# Patient Record
Sex: Female | Born: 1975 | Race: Black or African American | Hispanic: No | Marital: Married | Smoking: Never smoker
Health system: Southern US, Community
[De-identification: ages and names within clinical notes are randomized; demographics above are authoritative.]

## PROBLEM LIST (undated history)

## (undated) DIAGNOSIS — E119 Type 2 diabetes mellitus without complications: Secondary | ICD-10-CM

## (undated) DIAGNOSIS — I10 Essential (primary) hypertension: Secondary | ICD-10-CM

## (undated) HISTORY — PX: OOPHORECTOMY: SHX86

## (undated) HISTORY — PX: CARPAL TUNNEL RELEASE: SHX101

## (undated) HISTORY — DX: Type 2 diabetes mellitus without complications: E11.9

---

## 1998-11-16 ENCOUNTER — Other Ambulatory Visit: Admission: RE | Admit: 1998-11-16 | Discharge: 1998-11-16 | Payer: Self-pay | Admitting: *Deleted

## 1999-06-22 ENCOUNTER — Inpatient Hospital Stay (HOSPITAL_COMMUNITY): Admission: AD | Admit: 1999-06-22 | Discharge: 1999-06-22 | Payer: Self-pay | Admitting: Obstetrics

## 1999-07-02 ENCOUNTER — Inpatient Hospital Stay (HOSPITAL_COMMUNITY): Admission: AD | Admit: 1999-07-02 | Discharge: 1999-07-02 | Payer: Self-pay | Admitting: Obstetrics & Gynecology

## 1999-08-07 ENCOUNTER — Emergency Department (HOSPITAL_COMMUNITY): Admission: EM | Admit: 1999-08-07 | Discharge: 1999-08-07 | Payer: Self-pay | Admitting: Emergency Medicine

## 1999-11-29 ENCOUNTER — Other Ambulatory Visit: Admission: RE | Admit: 1999-11-29 | Discharge: 1999-11-29 | Payer: Self-pay | Admitting: *Deleted

## 2000-05-12 ENCOUNTER — Emergency Department (HOSPITAL_COMMUNITY): Admission: EM | Admit: 2000-05-12 | Discharge: 2000-05-12 | Payer: Self-pay

## 2001-06-19 ENCOUNTER — Inpatient Hospital Stay (HOSPITAL_COMMUNITY): Admission: AD | Admit: 2001-06-19 | Discharge: 2001-06-19 | Payer: Self-pay | Admitting: *Deleted

## 2001-12-23 ENCOUNTER — Emergency Department (HOSPITAL_COMMUNITY): Admission: EM | Admit: 2001-12-23 | Discharge: 2001-12-24 | Payer: Self-pay | Admitting: Emergency Medicine

## 2002-06-22 ENCOUNTER — Inpatient Hospital Stay (HOSPITAL_COMMUNITY): Admission: AD | Admit: 2002-06-22 | Discharge: 2002-06-22 | Payer: Self-pay | Admitting: Obstetrics and Gynecology

## 2004-07-26 ENCOUNTER — Other Ambulatory Visit: Admission: RE | Admit: 2004-07-26 | Discharge: 2004-07-26 | Payer: Self-pay | Admitting: Obstetrics and Gynecology

## 2004-10-14 ENCOUNTER — Ambulatory Visit (HOSPITAL_COMMUNITY): Admission: RE | Admit: 2004-10-14 | Discharge: 2004-10-14 | Payer: Self-pay | Admitting: Obstetrics and Gynecology

## 2005-06-14 ENCOUNTER — Other Ambulatory Visit: Admission: RE | Admit: 2005-06-14 | Discharge: 2005-06-14 | Payer: Self-pay | Admitting: Obstetrics and Gynecology

## 2005-11-18 ENCOUNTER — Inpatient Hospital Stay (HOSPITAL_COMMUNITY): Admission: AD | Admit: 2005-11-18 | Discharge: 2005-11-18 | Payer: Self-pay | Admitting: Family Medicine

## 2014-06-12 ENCOUNTER — Other Ambulatory Visit: Payer: Self-pay | Admitting: Obstetrics and Gynecology

## 2014-06-17 LAB — CYTOLOGY - PAP

## 2014-07-09 ENCOUNTER — Emergency Department (HOSPITAL_COMMUNITY): Payer: BC Managed Care – PPO

## 2014-07-09 ENCOUNTER — Encounter (HOSPITAL_COMMUNITY): Payer: Self-pay | Admitting: Emergency Medicine

## 2014-07-09 ENCOUNTER — Inpatient Hospital Stay (HOSPITAL_COMMUNITY)
Admission: EM | Admit: 2014-07-09 | Discharge: 2014-07-13 | DRG: 390 | Disposition: A | Discharge status: Home / Self Care | Payer: BC Managed Care – PPO | Attending: General Surgery | Admitting: General Surgery

## 2014-07-09 DIAGNOSIS — K5669 Other intestinal obstruction: Secondary | ICD-10-CM | POA: Diagnosis not present

## 2014-07-09 DIAGNOSIS — Z882 Allergy status to sulfonamides status: Secondary | ICD-10-CM

## 2014-07-09 DIAGNOSIS — I10 Essential (primary) hypertension: Secondary | ICD-10-CM | POA: Diagnosis present

## 2014-07-09 DIAGNOSIS — K56609 Unspecified intestinal obstruction, unspecified as to partial versus complete obstruction: Secondary | ICD-10-CM | POA: Diagnosis present

## 2014-07-09 DIAGNOSIS — N809 Endometriosis, unspecified: Secondary | ICD-10-CM | POA: Diagnosis present

## 2014-07-09 DIAGNOSIS — Z90721 Acquired absence of ovaries, unilateral: Secondary | ICD-10-CM | POA: Diagnosis present

## 2014-07-09 HISTORY — DX: Essential (primary) hypertension: I10

## 2014-07-09 LAB — COMPREHENSIVE METABOLIC PANEL
ALBUMIN: 4 g/dL (ref 3.5–5.2)
ALK PHOS: 104 U/L (ref 39–117)
ALT: 61 U/L — ABNORMAL HIGH (ref 0–35)
AST: 29 U/L (ref 0–37)
Anion gap: 17 — ABNORMAL HIGH (ref 5–15)
BUN: 10 mg/dL (ref 6–23)
CO2: 20 mEq/L (ref 19–32)
Calcium: 9.5 mg/dL (ref 8.4–10.5)
Chloride: 100 mEq/L (ref 96–112)
Creatinine, Ser: 0.6 mg/dL (ref 0.50–1.10)
GFR calc Af Amer: >90 mL/min (ref >90)
GFR calc non Af Amer: >90 mL/min (ref >90)
Glucose, Bld: 172 mg/dL — ABNORMAL HIGH (ref 70–99)
POTASSIUM: 3.8 meq/L (ref 3.7–5.3)
Sodium: 137 mEq/L (ref 137–147)
TOTAL PROTEIN: 8.7 g/dL — AB (ref 6.0–8.3)
Total Bilirubin: 0.6 mg/dL (ref 0.3–1.2)

## 2014-07-09 LAB — CBC WITH DIFFERENTIAL/PLATELET
BASOS ABS: 0 10*3/uL (ref 0.0–0.1)
BASOS PCT: 0 % (ref 0–1)
EOS ABS: 0 10*3/uL (ref 0.0–0.7)
Eosinophils Relative: 0 % (ref 0–5)
HCT: 38.6 % (ref 36.0–46.0)
Hemoglobin: 13.5 g/dL (ref 12.0–15.0)
Lymphocytes Relative: 4 % — ABNORMAL LOW (ref 12–46)
Lymphs Abs: 0.6 10*3/uL — ABNORMAL LOW (ref 0.7–4.0)
MCH: 26.4 pg (ref 26.0–34.0)
MCHC: 35 g/dL (ref 30.0–36.0)
MCV: 75.4 fL — ABNORMAL LOW (ref 78.0–100.0)
MONOS PCT: 6 % (ref 3–12)
Monocytes Absolute: 0.8 10*3/uL (ref 0.1–1.0)
NEUTROS PCT: 90 % — AB (ref 43–77)
Neutro Abs: 11.7 10*3/uL — ABNORMAL HIGH (ref 1.7–7.7)
PLATELETS: 407 10*3/uL — AB (ref 150–400)
RBC: 5.12 MIL/uL — ABNORMAL HIGH (ref 3.87–5.11)
RDW: 15.3 % (ref 11.5–15.5)
WBC: 13 10*3/uL — ABNORMAL HIGH (ref 4.0–10.5)

## 2014-07-09 LAB — URINE MICROSCOPIC-ADD ON

## 2014-07-09 LAB — URINALYSIS, ROUTINE W REFLEX MICROSCOPIC
Bilirubin Urine: NEGATIVE
Glucose, UA: NEGATIVE mg/dL
KETONES UR: 40 mg/dL — AB
Leukocytes, UA: NEGATIVE
NITRITE: NEGATIVE
Protein, ur: 100 mg/dL — AB
SPECIFIC GRAVITY, URINE: 1.018 (ref 1.005–1.030)
UROBILINOGEN UA: 1 mg/dL (ref 0.0–1.0)
pH: 5.5 (ref 5.0–8.0)

## 2014-07-09 LAB — I-STAT TROPONIN, ED: Troponin i, poc: 0 ng/mL (ref 0.00–0.08)

## 2014-07-09 LAB — POC URINE PREG, ED: PREG TEST UR: NEGATIVE

## 2014-07-09 LAB — LIPASE, BLOOD: LIPASE: 20 U/L (ref 11–59)

## 2014-07-09 MED ORDER — ONDANSETRON HCL 4 MG/2ML IJ SOLN
4.0000 mg | Freq: Four times a day (QID) | INTRAMUSCULAR | Status: DC | PRN
Start: 2014-07-09 — End: 2014-07-12
  Administered 2014-07-09 – 2014-07-11 (×4): 4 mg via INTRAVENOUS
  Filled 2014-07-09 (×4): qty 2

## 2014-07-09 MED ORDER — MORPHINE SULFATE 2 MG/ML IJ SOLN
2.0000 mg | INTRAMUSCULAR | Status: DC | PRN
  Administered 2014-07-09 – 2014-07-10 (×8): 4 mg via INTRAVENOUS
  Administered 2014-07-11 (×4): 2 mg via INTRAVENOUS
  Administered 2014-07-11: 4 mg via INTRAVENOUS
  Administered 2014-07-11 (×2): 2 mg via INTRAVENOUS
  Filled 2014-07-09 (×2): qty 2
  Filled 2014-07-09 (×4): qty 1
  Filled 2014-07-09 (×4): qty 2
  Filled 2014-07-09: qty 1
  Filled 2014-07-09: qty 2
  Filled 2014-07-09: qty 1
  Filled 2014-07-09 (×2): qty 2

## 2014-07-09 MED ORDER — ONDANSETRON HCL 4 MG/2ML IJ SOLN
4.0000 mg | Freq: Once | INTRAMUSCULAR | Status: AC
  Administered 2014-07-09: 4 mg via INTRAVENOUS
  Filled 2014-07-09: qty 2

## 2014-07-09 MED ORDER — IOHEXOL 300 MG/ML  SOLN
50.0000 mL | Freq: Once | INTRAMUSCULAR | Status: AC | PRN
  Administered 2014-07-09: 50 mL via ORAL

## 2014-07-09 MED ORDER — ENOXAPARIN SODIUM 40 MG/0.4ML ~~LOC~~ SOLN
40.0000 mg | Freq: Every day | SUBCUTANEOUS | Status: DC
  Administered 2014-07-09 – 2014-07-13 (×5): 40 mg via SUBCUTANEOUS
  Filled 2014-07-09 (×5): qty 0.4

## 2014-07-09 MED ORDER — LIDOCAINE HCL 2 % EX GEL
CUTANEOUS | Status: AC
  Administered 2014-07-09: 10
  Filled 2014-07-09: qty 10

## 2014-07-09 MED ORDER — SODIUM CHLORIDE 0.9 % IV BOLUS (SEPSIS)
1000.0000 mL | Freq: Once | INTRAVENOUS | Status: AC
  Administered 2014-07-09: 1000 mL via INTRAVENOUS

## 2014-07-09 MED ORDER — PHENOL 1.4 % MT LIQD
1.0000 | OROMUCOSAL | Status: DC | PRN
  Administered 2014-07-09: 1 via OROMUCOSAL
  Filled 2014-07-09 (×2): qty 177

## 2014-07-09 MED ORDER — MORPHINE SULFATE 4 MG/ML IJ SOLN
4.0000 mg | Freq: Once | INTRAMUSCULAR | Status: AC
  Administered 2014-07-09: 4 mg via INTRAVENOUS
  Filled 2014-07-09: qty 1

## 2014-07-09 MED ORDER — LACTATED RINGERS IV SOLN
INTRAVENOUS | Status: DC
  Administered 2014-07-09: 100 via INTRAVENOUS
  Administered 2014-07-09 – 2014-07-11 (×3): via INTRAVENOUS
  Administered 2014-07-11: 100 mL via INTRAVENOUS

## 2014-07-09 MED ORDER — IOHEXOL 300 MG/ML  SOLN
100.0000 mL | Freq: Once | INTRAMUSCULAR | Status: AC | PRN
  Administered 2014-07-09: 100 mL via INTRAVENOUS

## 2014-07-09 MED ORDER — MORPHINE SULFATE 4 MG/ML IJ SOLN
4.0000 mg | Freq: Once | INTRAMUSCULAR | Status: AC
Start: 2014-07-09 — End: 2014-07-09
  Administered 2014-07-09: 4 mg via INTRAVENOUS
  Filled 2014-07-09: qty 1

## 2014-07-09 NOTE — H&P (Signed)
Amy Klein is an 38 y.o. female.   Chief Complaint: nausea, vomiting HPI: Patient is a 38 year old female with history of hypertension who presents to the emergency department for evaluation of abdominal pain. She reports that the abdominal pain began last night and worsened today. It was associated with vomiting.  She has not had any diarrhea. Last bowel movement was yesterday. She describes her abdominal pain as a bloating feeling. PSH is significant for a right oophorectomy. No fevers, chills.   Past Medical History  Diagnosis Date  . Hypertension     Past Surgical History  Procedure Laterality Date  . Carpal tunnel release    . Oophorectomy Right     History reviewed. No pertinent family history. Social History:  reports that she has never smoked. She has never used smokeless tobacco. She reports that she does not drink alcohol or use illicit drugs.  Allergies:  Allergies  Allergen Reactions  . Sulfa Antibiotics Hives     (Not in a hospital admission)  Results for orders placed during the hospital encounter of 07/09/14 (from the past 48 hour(s))  URINALYSIS, ROUTINE W REFLEX MICROSCOPIC     Status: Abnormal   Collection Time    07/09/14  1:26 AM      Result Value Ref Range   Color, Urine AMBER (*) YELLOW   Comment: BIOCHEMICALS MAY BE AFFECTED BY COLOR   APPearance CLOUDY (*) CLEAR   Specific Gravity, Urine 1.018  1.005 - 1.030   pH 5.5  5.0 - 8.0   Glucose, UA NEGATIVE  NEGATIVE mg/dL   Hgb urine dipstick LARGE (*) NEGATIVE   Bilirubin Urine NEGATIVE  NEGATIVE   Ketones, ur 40 (*) NEGATIVE mg/dL   Protein, ur 100 (*) NEGATIVE mg/dL   Urobilinogen, UA 1.0  0.0 - 1.0 mg/dL   Nitrite NEGATIVE  NEGATIVE   Leukocytes, UA NEGATIVE  NEGATIVE  URINE MICROSCOPIC-ADD ON     Status: Abnormal   Collection Time    07/09/14  1:26 AM      Result Value Ref Range   Squamous Epithelial / LPF RARE  RARE   WBC, UA 0-2  <3 WBC/hpf   RBC / HPF 21-50  <3 RBC/hpf   Bacteria,  UA FEW (*) RARE   Urine-Other MUCOUS PRESENT    CBC WITH DIFFERENTIAL     Status: Abnormal   Collection Time    07/09/14  1:29 AM      Result Value Ref Range   WBC 13.0 (*) 4.0 - 10.5 K/uL   RBC 5.12 (*) 3.87 - 5.11 MIL/uL   Hemoglobin 13.5  12.0 - 15.0 g/dL   HCT 38.6  36.0 - 46.0 %   MCV 75.4 (*) 78.0 - 100.0 fL   MCH 26.4  26.0 - 34.0 pg   MCHC 35.0  30.0 - 36.0 g/dL   RDW 15.3  11.5 - 15.5 %   Platelets 407 (*) 150 - 400 K/uL   Neutrophils Relative % 90 (*) 43 - 77 %   Neutro Abs 11.7 (*) 1.7 - 7.7 K/uL   Lymphocytes Relative 4 (*) 12 - 46 %   Lymphs Abs 0.6 (*) 0.7 - 4.0 K/uL   Monocytes Relative 6  3 - 12 %   Monocytes Absolute 0.8  0.1 - 1.0 K/uL   Eosinophils Relative 0  0 - 5 %   Eosinophils Absolute 0.0  0.0 - 0.7 K/uL   Basophils Relative 0  0 - 1 %   Basophils Absolute  0.0  0.0 - 0.1 K/uL  COMPREHENSIVE METABOLIC PANEL     Status: Abnormal   Collection Time    07/09/14  1:29 AM      Result Value Ref Range   Sodium 137  137 - 147 mEq/L   Potassium 3.8  3.7 - 5.3 mEq/L   Chloride 100  96 - 112 mEq/L   CO2 20  19 - 32 mEq/L   Glucose, Bld 172 (*) 70 - 99 mg/dL   BUN 10  6 - 23 mg/dL   Creatinine, Ser 0.60  0.50 - 1.10 mg/dL   Calcium 9.5  8.4 - 10.5 mg/dL   Total Protein 8.7 (*) 6.0 - 8.3 g/dL   Albumin 4.0  3.5 - 5.2 g/dL   AST 29  0 - 37 U/L   ALT 61 (*) 0 - 35 U/L   Alkaline Phosphatase 104  39 - 117 U/L   Total Bilirubin 0.6  0.3 - 1.2 mg/dL   GFR calc non Af Amer >90  >90 mL/min   GFR calc Af Amer >90  >90 mL/min   Comment: (NOTE)     The eGFR has been calculated using the CKD EPI equation.     This calculation has not been validated in all clinical situations.     eGFR's persistently <90 mL/min signify possible Chronic Kidney     Disease.   Anion gap 17 (*) 5 - 15  LIPASE, BLOOD     Status: None   Collection Time    07/09/14  1:29 AM      Result Value Ref Range   Lipase 20  11 - 59 U/L  I-STAT TROPOININ, ED     Status: None   Collection Time     07/09/14  1:38 AM      Result Value Ref Range   Troponin i, poc 0.00  0.00 - 0.08 ng/mL   Comment 3            Comment: Due to the release kinetics of cTnI,     a negative result within the first hours     of the onset of symptoms does not rule out     myocardial infarction with certainty.     If myocardial infarction is still suspected,     repeat the test at appropriate intervals.  POC URINE PREG, ED     Status: None   Collection Time    07/09/14  1:38 AM      Result Value Ref Range   Preg Test, Ur NEGATIVE  NEGATIVE   Comment:            THE SENSITIVITY OF THIS     METHODOLOGY IS >24 mIU/mL   Ct Abdomen Pelvis W Contrast  07/09/2014   CLINICAL DATA:  Nausea and vomiting for 1 day. Elevated white cell count. Microhematuria.  EXAM: CT ABDOMEN AND PELVIS WITH CONTRAST  TECHNIQUE: Multidetector CT imaging of the abdomen and pelvis was performed using the standard protocol following bolus administration of intravenous contrast.  CONTRAST:  175m OMNIPAQUE IOHEXOL 300 MG/ML  SOLN  COMPARISON:  None.  FINDINGS: Mild atelectasis in the lung bases. Residual contrast material in the lower esophagus may indicate reflux or decreased motility.  The liver, spleen, gallbladder, pancreas, adrenal glands, kidneys, abdominal aorta, inferior vena cava, and retroperitoneal lymph nodes are unremarkable. Stomach is normal. There is proximal small bowel dilatation with distention of fluid filled loops and decompression of the distal small bowel. Transition zone  appears to be in the mid pelvis. Appearance is consistent with small bowel obstruction. There is a vague soft tissue chains demonstrated at the transition zone which may indicate mass or endometriosis. Small amount of free fluid in the mesenteries and pelvic gutters. Colon is stool filled without abnormal distention. No free air in the abdomen.  Pelvis: Appendix is normal. Bladder wall is not thickened. Uterus and ovaries are not enlarged. No destructive bone  lesions. There is mild small bowel wall thickening suggesting inflammation or enteritis.  IMPRESSION: Small bowel obstruction with transition zone in the mid pelvis. Suggestion of enhancing tissue suggesting possible mass or endometriosis at the level of obstruction. Mild small bowel wall thickening and mesenteric fluid are likely reactive or inflammatory.   Electronically Signed   By: Lucienne Capers M.D.   On: 07/09/2014 04:39    Review of Systems  Constitutional: Negative for fever and chills.  Respiratory: Negative for cough and shortness of breath.   Cardiovascular: Negative for chest pain.  Gastrointestinal: Positive for nausea, vomiting and abdominal pain. Negative for diarrhea and constipation.  Genitourinary: Negative for dysuria, urgency and frequency.  Skin: Negative for rash.  Neurological: Negative for headaches.    Blood pressure 122/71, pulse 77, temperature 98.8 F (37.1 C), temperature source Oral, resp. rate 18, last menstrual period 07/07/2014, SpO2 93.00%. Physical Exam  Constitutional: She is oriented to person, place, and time. She appears well-developed and well-nourished.  HENT:  Head: Normocephalic and atraumatic.  Eyes: Conjunctivae are normal. Pupils are equal, round, and reactive to light.  Neck: Normal range of motion.  Cardiovascular: Normal rate and regular rhythm.   Respiratory: Effort normal and breath sounds normal. No respiratory distress.  GI: Soft. She exhibits distension. There is no tenderness. There is no rebound and no guarding.  Musculoskeletal: Normal range of motion.  Neurological: She is alert and oriented to person, place, and time.  Skin: Skin is warm and dry.     Assessment/Plan Amy Klein is a 38 y.o. F with known endometriosis who presents to the ED with a SBO.  On CT there is a transition point associated with a mass near the fundus of her uterus.  We will admit to the floor and place an NG to LWS.  IVF's for hydration and  morphine for pain control.  Seriel abd exams.  Recheck labs in AM.  [place], Lee Kalt C. 36/0/6770, 3:40 AM

## 2014-07-09 NOTE — ED Notes (Signed)
Pt reports that Surgeon has decided to wait and see if obstruction reverses w/ NG tube.

## 2014-07-09 NOTE — Progress Notes (Signed)
[facility] have [facility]ptic [facility], order entered Stanford BreedBracey, Niesha Bame N RN 07-09-2014 10:41am

## 2014-07-09 NOTE — ED Notes (Signed)
Pt complains of vomiting all day

## 2014-07-09 NOTE — ED Provider Notes (Signed)
CSN: [id number]     Arrival date & time 07/09/14  0053 History   First MD Initiated Contact with Patient 07/09/14 0147     Chief Complaint  Patient presents with  . Emesis     (Consider location/radiation/quality/duration/timing/severity/associated sxs/prior Treatment) HPI Comments: Patient is a 38 year old female with history of hypertension who presents to the emergency department for evaluation of abdominal pain. She reports that the abdominal pain began last night and worsened today. It was associated with vomiting. Emesis was nonbilious, nonbloody. She has not had any diarrhea. Last bowel movement was yesterday which was normal for her. She describes her abdominal pain as a bloating feeling. She feels as though her abdomen is distended. She had a right oophorectomy due to endometriosis. Her GYN physician is Dr. Rana Snare at [facility]. She denies any recent travel, suspicious food intake. No fevers, chills.  The history is provided by the patient. No language interpreter was used.    Past Medical History  Diagnosis Date  . Hypertension    Past Surgical History  Procedure Laterality Date  . Carpal tunnel release    . Oophorectomy Right    History reviewed. No pertinent family history. History  Substance Use Topics  . Smoking status: Never Smoker   . Smokeless tobacco: Never Used  . Alcohol Use: No   OB History   Grav Para Term Preterm Abortions TAB SAB Ect Mult Living                 Review of Systems  Constitutional: Negative for fever and chills.  Respiratory: Negative for shortness of breath.   Cardiovascular: Negative for chest pain.  Gastrointestinal: Positive for nausea, vomiting, abdominal pain and abdominal distention. Negative for diarrhea and constipation.  Genitourinary: Negative for dysuria.  All other systems reviewed and are negative.     Allergies  Sulfa antibiotics  Home Medications   Prior to Admission medications   Medication Sig  Start Date End Date Taking? Authorizing Provider  amLODipine (NORVASC) 10 MG tablet Take 10 mg by mouth daily.   Yes Historical Provider, MD  carvedilol (COREG) 12.5 MG tablet Take 12.5 mg by mouth daily.   Yes Historical Provider, MD  ibuprofen (ADVIL,MOTRIN) 200 MG tablet Take 400 mg by mouth every 6 (six) hours as needed for moderate pain.   Yes Historical Provider, MD  Multiple Vitamin (MULTIVITAMIN WITH MINERALS) TABS tablet Take 1 tablet by mouth daily.   Yes Historical Provider, MD   BP 128/77  Pulse 87  Temp(Src) 98.8 F (37.1 C) (Oral)  Resp 18  SpO2 99%  LMP 07/07/2014 Physical Exam  Nursing note and vitals reviewed. Constitutional: She is oriented to person, place, and time. She appears well-developed and well-nourished. No distress.  HENT:  Head: Normocephalic and atraumatic.  Right Ear: External ear normal.  Left Ear: External ear normal.  Nose: Nose normal.  Mouth/Throat: Oropharynx is clear and moist.  Eyes: Conjunctivae are normal.  Neck: Normal range of motion.  Cardiovascular: Normal rate, regular rhythm and normal heart sounds.   Pulmonary/Chest: Effort normal and breath sounds normal. No stridor. No respiratory distress. She has no wheezes. She has no rales.  Abdominal: Soft. She exhibits distension. There is generalized tenderness. There is no rigidity and no guarding.  Abdomen appears distended  Musculoskeletal: Normal range of motion.  Neurological: She is alert and oriented to person, place, and time. She has normal strength.  Skin: Skin is warm and dry. She is not diaphoretic. No  erythema.  Psychiatric: She has a normal mood and affect. Her behavior is normal.    ED Course  Procedures (including critical care time) Labs Review Labs Reviewed  CBC WITH DIFFERENTIAL - Abnormal; Notable for the following:    WBC 13.0 (*)    RBC 5.12 (*)    MCV 75.4 (*)    Platelets 407 (*)    Neutrophils Relative % 90 (*)    Neutro Abs 11.7 (*)    Lymphocytes Relative  4 (*)    Lymphs Abs 0.6 (*)    All other components within normal limits  COMPREHENSIVE METABOLIC PANEL - Abnormal; Notable for the following:    Glucose, Bld 172 (*)    Total Protein 8.7 (*)    ALT 61 (*)    Anion gap 17 (*)    All other components within normal limits  URINALYSIS, ROUTINE W REFLEX MICROSCOPIC - Abnormal; Notable for the following:    Color, Urine AMBER (*)    APPearance CLOUDY (*)    Hgb urine dipstick LARGE (*)    Ketones, ur 40 (*)    Protein, ur 100 (*)    All other components within normal limits  URINE MICROSCOPIC-ADD ON - Abnormal; Notable for the following:    Bacteria, UA FEW (*)    All other components within normal limits  LIPASE, BLOOD  I-STAT TROPOININ, ED  POC URINE PREG, ED    Imaging Review Ct Abdomen Pelvis W Contrast  07/09/2014   CLINICAL DATA:  Nausea and vomiting for 1 day. Elevated white cell count. Microhematuria.  EXAM: CT ABDOMEN AND PELVIS WITH CONTRAST  TECHNIQUE: Multidetector CT imaging of the abdomen and pelvis was performed using the standard protocol following bolus administration of intravenous contrast.  CONTRAST:  100mL OMNIPAQUE IOHEXOL 300 MG/ML  SOLN  COMPARISON:  None.  FINDINGS: Mild atelectasis in the lung bases. Residual contrast material in the lower esophagus may indicate reflux or decreased motility.  The liver, spleen, gallbladder, pancreas, adrenal glands, kidneys, abdominal aorta, inferior vena cava, and retroperitoneal lymph nodes are unremarkable. Stomach is normal. There is proximal small bowel dilatation with distention of fluid filled loops and decompression of the distal small bowel. Transition zone appears to be in the mid pelvis. Appearance is consistent with small bowel obstruction. There is a vague soft tissue chains demonstrated at the transition zone which may indicate mass or endometriosis. Small amount of free fluid in the mesenteries and pelvic gutters. Colon is stool filled without abnormal distention. No free  air in the abdomen.  Pelvis: Appendix is normal. Bladder wall is not thickened. Uterus and ovaries are not enlarged. No destructive bone lesions. There is mild small bowel wall thickening suggesting inflammation or enteritis.  IMPRESSION: Small bowel obstruction with transition zone in the mid pelvis. Suggestion of enhancing tissue suggesting possible mass or endometriosis at the level of obstruction. Mild small bowel wall thickening and mesenteric fluid are likely reactive or inflammatory.   Electronically Signed   By: Burman NievesWilliam  Stevens M.D.   On: 07/09/2014 04:39     EKG Interpretation None      MDM   Final diagnoses:  Small bowel obstruction    Patient presents to ED with SBO. Patient is feeling improved after IV narcotics. NG tube placed. Discussed case with Dr. Maisie Fushomas of [facility] who will admit patient. Admission is appreciated. Vital signs stable at this time. Discussed case with Dr. Rhunette CroftNanavati who agrees with plan. Patient / Family / Caregiver informed of clinical course,  understand medical decision-making process, and agree with plan.     Mora Bellman, PA-C 07/09/14 [phone]

## 2014-07-10 ENCOUNTER — Inpatient Hospital Stay (HOSPITAL_COMMUNITY): Payer: BC Managed Care – PPO

## 2014-07-10 LAB — BASIC METABOLIC PANEL
Anion gap: 12 (ref 5–15)
BUN: 8 mg/dL (ref 6–23)
CO2: 24 mEq/L (ref 19–32)
Calcium: 8.2 mg/dL — ABNORMAL LOW (ref 8.4–10.5)
Chloride: 103 mEq/L (ref 96–112)
Creatinine, Ser: 0.63 mg/dL (ref 0.50–1.10)
Glucose, Bld: 92 mg/dL (ref 70–99)
POTASSIUM: 3.5 meq/L — AB (ref 3.7–5.3)
SODIUM: 139 meq/L (ref 137–147)

## 2014-07-10 LAB — CBC
HCT: 30.7 % — ABNORMAL LOW (ref 36.0–46.0)
Hemoglobin: 10.2 g/dL — ABNORMAL LOW (ref 12.0–15.0)
MCH: 25.5 pg — ABNORMAL LOW (ref 26.0–34.0)
MCHC: 33.2 g/dL (ref 30.0–36.0)
MCV: 76.8 fL — AB (ref 78.0–100.0)
PLATELETS: 300 10*3/uL (ref 150–400)
RBC: 4 MIL/uL (ref 3.87–5.11)
RDW: 15.6 % — ABNORMAL HIGH (ref 11.5–15.5)
WBC: 5.8 10*3/uL (ref 4.0–10.5)

## 2014-07-10 MED ORDER — BISACODYL 10 MG RE SUPP
10.0000 mg | Freq: Once | RECTAL | Status: AC
  Administered 2014-07-10: 10 mg via RECTAL
  Filled 2014-07-10: qty 1

## 2014-07-10 MED ORDER — METOPROLOL TARTRATE 1 MG/ML IV SOLN
5.0000 mg | Freq: Four times a day (QID) | INTRAVENOUS | Status: DC
  Administered 2014-07-10 – 2014-07-12 (×7): 5 mg via INTRAVENOUS
  Filled 2014-07-10 (×11): qty 5

## 2014-07-10 MED ORDER — METOPROLOL TARTRATE 1 MG/ML IV SOLN
2.5000 mg | Freq: Four times a day (QID) | INTRAVENOUS | Status: DC
  Administered 2014-07-10: 2.5 mg via INTRAVENOUS
  Filled 2014-07-10 (×4): qty 5

## 2014-07-10 MED ORDER — POTASSIUM CHLORIDE 10 MEQ/100ML IV SOLN
10.0000 meq | INTRAVENOUS | Status: AC
  Administered 2014-07-10 (×2): 10 meq via INTRAVENOUS
  Filled 2014-07-10 (×2): qty 100

## 2014-07-10 NOTE — ED Provider Notes (Signed)
Medical screening examination/treatment/procedure(s) were performed by non-physician practitioner and as supervising physician I was immediately available for consultation/collaboration.   EKG Interpretation None        Derwood KaplanAnkit Carlita Whitcomb, MD 07/10/14 [id number]

## 2014-07-10 NOTE — Progress Notes (Signed)
HD#2  Pt tells me she had 2 episodes of flatus overnight (differs from what she told NP) Still with some pain NG - mainly with clear secretions/water. No bilious output. Flushed easily  Alert, nontoxic Obese, soft, mild lower abd TTP  No fever, no tachycardia, +flatus, contrast in colon now on xray today Will cont with bowel rest, ng tube decompression Replace potassium Ambulate, OOB  Mary SellaEric M. Andrey CampanileWilson, MD, FACS General, Bariatric, & Minimally Invasive [facility] [place]

## 2014-07-10 NOTE — Progress Notes (Signed)
Patient ID: Amy Klein, female   DOB: 05-26-1976, 38 y.o.   MRN: [id number]     [facility]      [address] [place], [place] [address]    Phone: [phone] FAX: [phone]     Subjective: Small bm yesterday, no flatus.  Still nauseated.  NGT in place.  VSS.  Afebrile.   Objective:  Vital signs:  Filed Vitals:   07/09/14 1810 07/09/14 2208 07/10/14 0144 07/10/14 0542  BP: 140/79 147/80 139/78 142/75  Pulse: 71 70 79 79  Temp: 98.3 F (36.8 C) 98.3 F (36.8 C) 98.4 F (36.9 C) 98.5 F (36.9 C)  TempSrc: Axillary Oral Oral Oral  Resp: '16 16 16 16  ' Height:      Weight:      SpO2: 99% 97% 98% 97%    Last BM Date: 07/07/14  Intake/Output   Yesterday:  10/01 0701 - 10/02 0700 In: 2095 [I.V.:2095] Out: 1750 [Urine:1300; Emesis/NG output:450] This shift:  Total I/O In: -  Out: 500 [Urine:500]  Physical Exam: General: Pt awake/alert/oriented x4 in no acute distress Chest: cta. No chest wall pain w good excursion CV:  Pulses intact.  Regular rhythm Abdomen: Soft.  distended.  TTP pelvic region.  No evidence of peritonitis.  No incarcerated hernias. Ext:  SCDs BLE.  No mjr edema.  No cyanosis Skin: No petechiae / purpura   Problem List:   Active Problems:   SBO (small bowel obstruction)    Results:   Labs: Results for orders placed during the hospital encounter of 07/09/14 (from the past 48 hour(s))  URINALYSIS, ROUTINE W REFLEX MICROSCOPIC     Status: Abnormal   Collection Time    07/09/14  1:26 AM      Result Value Ref Range   Color, Urine AMBER (*) YELLOW   Comment: BIOCHEMICALS MAY BE AFFECTED BY COLOR   APPearance CLOUDY (*) CLEAR   Specific Gravity, Urine 1.018  1.005 - 1.030   pH 5.5  5.0 - 8.0   Glucose, UA NEGATIVE  NEGATIVE mg/dL   Hgb urine dipstick LARGE (*) NEGATIVE   Bilirubin Urine NEGATIVE  NEGATIVE   Ketones, ur 40 (*) NEGATIVE mg/dL   Protein, ur 100 (*) NEGATIVE mg/dL    Urobilinogen, UA 1.0  0.0 - 1.0 mg/dL   Nitrite NEGATIVE  NEGATIVE   Leukocytes, UA NEGATIVE  NEGATIVE  URINE MICROSCOPIC-ADD ON     Status: Abnormal   Collection Time    07/09/14  1:26 AM      Result Value Ref Range   Squamous Epithelial / LPF RARE  RARE   WBC, UA 0-2  <3 WBC/hpf   RBC / HPF 21-50  <3 RBC/hpf   Bacteria, UA FEW (*) RARE   Urine-Other MUCOUS PRESENT    CBC WITH DIFFERENTIAL     Status: Abnormal   Collection Time    07/09/14  1:29 AM      Result Value Ref Range   WBC 13.0 (*) 4.0 - 10.5 K/uL   RBC 5.12 (*) 3.87 - 5.11 MIL/uL   Hemoglobin 13.5  12.0 - 15.0 g/dL   HCT 38.6  36.0 - 46.0 %   MCV 75.4 (*) 78.0 - 100.0 fL   MCH 26.4  26.0 - 34.0 pg   MCHC 35.0  30.0 - 36.0 g/dL   RDW 15.3  11.5 - 15.5 %   Platelets 407 (*) 150 - 400 K/uL   Neutrophils Relative %  90 (*) 43 - 77 %   Neutro Abs 11.7 (*) 1.7 - 7.7 K/uL   Lymphocytes Relative 4 (*) 12 - 46 %   Lymphs Abs 0.6 (*) 0.7 - 4.0 K/uL   Monocytes Relative 6  3 - 12 %   Monocytes Absolute 0.8  0.1 - 1.0 K/uL   Eosinophils Relative 0  0 - 5 %   Eosinophils Absolute 0.0  0.0 - 0.7 K/uL   Basophils Relative 0  0 - 1 %   Basophils Absolute 0.0  0.0 - 0.1 K/uL  COMPREHENSIVE METABOLIC PANEL     Status: Abnormal   Collection Time    07/09/14  1:29 AM      Result Value Ref Range   Sodium 137  137 - 147 mEq/L   Potassium 3.8  3.7 - 5.3 mEq/L   Chloride 100  96 - 112 mEq/L   CO2 20  19 - 32 mEq/L   Glucose, Bld 172 (*) 70 - 99 mg/dL   BUN 10  6 - 23 mg/dL   Creatinine, Ser 0.60  0.50 - 1.10 mg/dL   Calcium 9.5  8.4 - 10.5 mg/dL   Total Protein 8.7 (*) 6.0 - 8.3 g/dL   Albumin 4.0  3.5 - 5.2 g/dL   AST 29  0 - 37 U/L   ALT 61 (*) 0 - 35 U/L   Alkaline Phosphatase 104  39 - 117 U/L   Total Bilirubin 0.6  0.3 - 1.2 mg/dL   GFR calc non Af Amer >90  >90 mL/min   GFR calc Af Amer >90  >90 mL/min   Comment: (NOTE)     The eGFR has been calculated using the CKD EPI equation.     This calculation has not been  validated in all clinical situations.     eGFR's persistently <90 mL/min signify possible Chronic Kidney     Disease.   Anion gap 17 (*) 5 - 15  LIPASE, BLOOD     Status: None   Collection Time    07/09/14  1:29 AM      Result Value Ref Range   Lipase 20  11 - 59 U/L  I-STAT TROPOININ, ED     Status: None   Collection Time    07/09/14  1:38 AM      Result Value Ref Range   Troponin i, poc 0.00  0.00 - 0.08 ng/mL   Comment 3            Comment: Due to the release kinetics of cTnI,     a negative result within the first hours     of the onset of symptoms does not rule out     myocardial infarction with certainty.     If myocardial infarction is still suspected,     repeat the test at appropriate intervals.  POC URINE PREG, ED     Status: None   Collection Time    07/09/14  1:38 AM      Result Value Ref Range   Preg Test, Ur NEGATIVE  NEGATIVE   Comment:            THE SENSITIVITY OF THIS     METHODOLOGY IS >24 mIU/mL  BASIC METABOLIC PANEL     Status: Abnormal   Collection Time    07/10/14  4:26 AM      Result Value Ref Range   Sodium 139  137 - 147 mEq/L   Potassium 3.5 (*)  3.7 - 5.3 mEq/L   Chloride 103  96 - 112 mEq/L   CO2 24  19 - 32 mEq/L   Glucose, Bld 92  70 - 99 mg/dL   BUN 8  6 - 23 mg/dL   Creatinine, Ser 0.63  0.50 - 1.10 mg/dL   Calcium 8.2 (*) 8.4 - 10.5 mg/dL   GFR calc non Af Amer >90  >90 mL/min   GFR calc Af Amer >90  >90 mL/min   Comment: (NOTE)     The eGFR has been calculated using the CKD EPI equation.     This calculation has not been validated in all clinical situations.     eGFR's persistently <90 mL/min signify possible Chronic Kidney     Disease.   Anion gap 12  5 - 15  CBC     Status: Abnormal   Collection Time    07/10/14  4:26 AM      Result Value Ref Range   WBC 5.8  4.0 - 10.5 K/uL   RBC 4.00  3.87 - 5.11 MIL/uL   Hemoglobin 10.2 (*) 12.0 - 15.0 g/dL   Comment: DELTA CHECK NOTED     REPEATED TO VERIFY   HCT 30.7 (*) 36.0 - 46.0 %    MCV 76.8 (*) 78.0 - 100.0 fL   MCH 25.5 (*) 26.0 - 34.0 pg   MCHC 33.2  30.0 - 36.0 g/dL   RDW 15.6 (*) 11.5 - 15.5 %   Platelets 300  150 - 400 K/uL   Comment: DELTA CHECK NOTED     REPEATED TO VERIFY     SPECIMEN CHECKED FOR CLOTS    Imaging / Studies: Ct Abdomen Pelvis W Contrast  07/09/2014   CLINICAL DATA:  Nausea and vomiting for 1 day. Elevated white cell count. Microhematuria.  EXAM: CT ABDOMEN AND PELVIS WITH CONTRAST  TECHNIQUE: Multidetector CT imaging of the abdomen and pelvis was performed using the standard protocol following bolus administration of intravenous contrast.  CONTRAST:  167m OMNIPAQUE IOHEXOL 300 MG/ML  SOLN  COMPARISON:  None.  FINDINGS: Mild atelectasis in the lung bases. Residual contrast material in the lower esophagus may indicate reflux or decreased motility.  The liver, spleen, gallbladder, pancreas, adrenal glands, kidneys, abdominal aorta, inferior vena cava, and retroperitoneal lymph nodes are unremarkable. Stomach is normal. There is proximal small bowel dilatation with distention of fluid filled loops and decompression of the distal small bowel. Transition zone appears to be in the mid pelvis. Appearance is consistent with small bowel obstruction. There is a vague soft tissue chains demonstrated at the transition zone which may indicate mass or endometriosis. Small amount of free fluid in the mesenteries and pelvic gutters. Colon is stool filled without abnormal distention. No free air in the abdomen.  Pelvis: Appendix is normal. Bladder wall is not thickened. Uterus and ovaries are not enlarged. No destructive bone lesions. There is mild small bowel wall thickening suggesting inflammation or enteritis.  IMPRESSION: Small bowel obstruction with transition zone in the mid pelvis. Suggestion of enhancing tissue suggesting possible mass or endometriosis at the level of obstruction. Mild small bowel wall thickening and mesenteric fluid are likely reactive or  inflammatory.   Electronically Signed   By: WLucienne CapersM.D.   On: 07/09/2014 04:39   Dg Abd 2 Views  07/10/2014   CLINICAL DATA:  Small-bowel obstruction with distended abdomen  EXAM: ABDOMEN - 2 VIEW  COMPARISON:  07/09/2014  FINDINGS: A nasogastric catheter is now in  place within the stomach. There remains multiple dilated loops of small bowel with air-fluid levels. Contrast material is now noted within the colon suggesting this is a partial small bowel obstruction. No free air is seen. No abnormal mass or abnormal calcifications are noted.  IMPRESSION: Persistent small bowel obstruction. The overall appearance is stable from the prior exam. Nasogastric catheter in adequate position.   Electronically Signed   By: Inez Catalina M.D.   On: 07/10/2014 08:58    Medications / Allergies:  Scheduled Meds: . enoxaparin (LOVENOX) injection  40 mg Subcutaneous Daily  . metoprolol  2.5 mg Intravenous 4 times per day   Continuous Infusions: . lactated ringers 488 application (89/16/94 5038)   PRN Meds:.morphine injection, ondansetron, phenol  Antibiotics: Anti-infectives   None        Assessment/Plan Hx endometriosis Oophorectomy SBO No improvement on AXR, 445m out of NGT, more water, little bile and gastric content, no stool.  NGT flushed to ensure its functional.  She has not passed flatus, had a small BM yesterday.   We will continue with conservative management for another day, repeat films tomorrow IV hydration Pain control Dulcolax x1 SCD/lovenox mobilize  HTN Hold amlodipine, metoprolol for coreg substitution   EErby Pian ANP-BC [facility] Pager [phone]([id number])   07/10/2014 10:21 AM

## 2014-07-11 ENCOUNTER — Inpatient Hospital Stay (HOSPITAL_COMMUNITY): Payer: BC Managed Care – PPO

## 2014-07-11 LAB — BASIC METABOLIC PANEL
Anion gap: 13 (ref 5–15)
BUN: 5 mg/dL — ABNORMAL LOW (ref 6–23)
CALCIUM: 8.6 mg/dL (ref 8.4–10.5)
CHLORIDE: 101 meq/L (ref 96–112)
CO2: 26 mEq/L (ref 19–32)
Creatinine, Ser: 0.55 mg/dL (ref 0.50–1.10)
GFR calc non Af Amer: >90 mL/min (ref >90)
Glucose, Bld: 88 mg/dL (ref 70–99)
Potassium: 3.3 mEq/L — ABNORMAL LOW (ref 3.7–5.3)
Sodium: 140 mEq/L (ref 137–147)

## 2014-07-11 NOTE — Progress Notes (Signed)
Patient ID: Amy Klein, female   DOB: 06/21/1976, 38 y.o.   MRN: [id number]  General Surgery - [facility] - Progress Note  Subjective: Patient up in room, family at bedside.  Denies nausea or emesis.  Mild pain.  Going for AXR this AM.  Objective: Vital signs in last 24 hours: Temp:  [98.5 F (36.9 C)-99.3 F (37.4 C)] 98.8 F (37.1 C) (10/03 0535) Pulse Rate:  [81-86] 85 (10/03 0535) Resp:  [18] 18 (10/03 0535) BP: (124-135)/(72-89) 135/89 mmHg (10/03 0535) SpO2:  [96 %-98 %] 98 % (10/03 0535) Last BM Date: 08/06/14  Intake/Output from previous day: 10/02 0701 - 10/03 0700 In: 2600 [I.V.:2400; IV Piggyback:200] Out: 3600 [Urine:1800; Emesis/NG output:1800]  Exam: HEENT - clear, not icteric Neck - soft Chest - clear bilaterally Cor - RRR, no murmur Abd - soft, mild distension; BS present; mild diffuse tenderness to palpation Ext - no significant edema Neuro - grossly intact, no focal deficits  Lab Results:   Recent Labs  07/09/14 0129 07/10/14 0426  WBC 13.0* 5.8  HGB 13.5 10.2*  HCT 38.6 30.7*  PLT 407* 300     Recent Labs  07/10/14 0426 07/11/14 0540  NA 139 140  K 3.5* 3.3*  CL 103 101  CO2 24 26  GLUCOSE 92 88  BUN 8 5*  CREATININE 0.63 0.55  CALCIUM 8.2* 8.6    Studies/Results: Dg Abd 2 Views  07/10/2014   CLINICAL DATA:  Small-bowel obstruction with distended abdomen  EXAM: ABDOMEN - 2 VIEW  COMPARISON:  07/09/2014  FINDINGS: A nasogastric catheter is now in place within the stomach. There remains multiple dilated loops of small bowel with air-fluid levels. Contrast material is now noted within the colon suggesting this is a partial small bowel obstruction. No free air is seen. No abnormal mass or abnormal calcifications are noted.  IMPRESSION: Persistent small bowel obstruction. The overall appearance is stable from the prior exam. Nasogastric catheter in adequate position.   Electronically Signed   By: Alcide CleverMark  Lukens M.D.    On: 07/10/2014 08:58    Assessment / Plan: 1.  SBO  Check AXR this AM  If improving, start clear liquid diet and clamp NG tube  IV hydration  Ambulate  Velora Hecklerodd M. Jaana Brodt, MD, [facility] Office: [phone]  07/11/2014

## 2014-07-12 MED ORDER — CARVEDILOL 12.5 MG PO TABS
12.5000 mg | ORAL_TABLET | Freq: Every day | ORAL | Status: DC
  Administered 2014-07-12 – 2014-07-13 (×2): 12.5 mg via ORAL
  Filled 2014-07-12 (×3): qty 1

## 2014-07-12 MED ORDER — AMLODIPINE BESYLATE 10 MG PO TABS
10.0000 mg | ORAL_TABLET | Freq: Every day | ORAL | Status: DC
  Administered 2014-07-12 – 2014-07-13 (×2): 10 mg via ORAL
  Filled 2014-07-12 (×2): qty 1

## 2014-07-12 NOTE — Progress Notes (Signed)
Patient ID: Amy HarderChristina Klein, female   DOB: [birth year], 38 y.o.   MRN: [id number]  General Surgery - [facility] - Progress Note  Subjective: Patient wants NG out - no nausea or emesis with NG clamped for 24 hrs.  No abd pain.  Passing flatus, no BM per pt.  Objective: Vital signs in last 24 hours: Temp:  [98.8 F (37.1 C)-99.6 F (37.6 C)] 98.8 F (37.1 C) (10/04 0602) Pulse Rate:  [67-92] 78 (10/04 0602) Resp:  [17-18] 18 (10/04 0602) BP: (118-137)/(66-84) 134/82 mmHg (10/04 0602) SpO2:  [96 %-100 %] 100 % (10/04 0602) Last BM Date: 07/10/14  Intake/Output from previous day: 10/03 0701 - 10/04 0700 In: 2577.5 [P.O.:1200; I.V.:1337.5; NG/GT:40] Out: 1350 [Urine:1350]  Exam: HEENT - clear, not icteric Neck - soft Chest - clear bilaterally Cor - RRR, no murmur Abd - soft, mild distension; no tenderness; BS present Ext - no significant edema Neuro - grossly intact, no focal deficits  Lab Results:   Recent Labs  07/10/14 0426  WBC 5.8  HGB 10.2*  HCT 30.7*  PLT 300     Recent Labs  07/10/14 0426 07/11/14 0540  NA 139 140  K 3.5* 3.3*  CL 103 101  CO2 24 26  GLUCOSE 92 88  BUN 8 5*  CREATININE 0.63 0.55  CALCIUM 8.2* 8.6    Studies/Results: Dg Abd 2 Views  07/11/2014   CLINICAL DATA:  Evaluate for small bowel obstruction. Abdominal soreness.  EXAM: ABDOMEN - 2 VIEW  COMPARISON:  One day prior and CT of 2 days prior  FINDINGS: Nasogastric tube terminates at distal stomach. The upright film demonstrates no free intraperitoneal air. Air-fluid levels within mid small bowel loops. Slightly less small bowel distention, 4.5 cm today versus 5.2 cm on the prior. Contrast within the colon.  IMPRESSION: Minimal improvement in partial small bowel obstruction. No free intraperitoneal air or other acute complication.   Electronically Signed   By: Jeronimo GreavesKyle  Talbot M.D.   On: 07/11/2014 10:37   Dg Abd 2 Views  07/10/2014   CLINICAL DATA:  Small-bowel  obstruction with distended abdomen  EXAM: ABDOMEN - 2 VIEW  COMPARISON:  07/09/2014  FINDINGS: A nasogastric catheter is now in place within the stomach. There remains multiple dilated loops of small bowel with air-fluid levels. Contrast material is now noted within the colon suggesting this is a partial small bowel obstruction. No free air is seen. No abnormal mass or abnormal calcifications are noted.  IMPRESSION: Persistent small bowel obstruction. The overall appearance is stable from the prior exam. Nasogastric catheter in adequate position.   Electronically Signed   By: Alcide CleverMark  Lukens M.D.   On: 07/10/2014 08:58    Assessment / Plan: 1.  Small bowel obstruction  Clinically resolving  Discontinue NG tube  Full liquid diet  Ambulate  Velora Hecklerodd M. Uri Covey, MD, [facility] Office: [phone]  07/12/2014

## 2014-07-13 ENCOUNTER — Encounter (HOSPITAL_COMMUNITY): Payer: Self-pay | Admitting: *Deleted

## 2014-07-13 NOTE — Discharge Instructions (Signed)
Small Bowel Obstruction °A small bowel obstruction is a blockage (obstruction) of the small intestine (small bowel). The small bowel is a long, slender tube that connects the stomach to the colon. Its job is to absorb nutrients from the fluids and foods you consume into the bloodstream.  °CAUSES  °There are many causes of intestinal blockage. The most common ones include: °· Hernias. This is a more common cause in children than adults. °· Inflammatory bowel disease (enteritis and colitis). °· Twisting of the bowel (volvulus). °· Tumors. °· Scar tissue (adhesions) from previous surgery or radiation treatment. °· Recent surgery. This may cause an acute small bowel obstruction called an ileus. °SYMPTOMS  °· Abdominal pain. This may be dull cramps or sharp pain. It may occur in one area or may be present in the entire abdomen. Pain can range from mild to severe, depending on the degree of obstruction. °· Nausea and vomiting. Vomit may be greenish or yellow bile color. °· Distended or swollen stomach. Abdominal bloating is a common symptom. °· Constipation. °· Lack of passing gas. °· Frequent belching. °· Diarrhea. This may occur if runny stool is able to leak around the obstruction. °DIAGNOSIS  °Your caregiver can usually diagnose small bowel obstruction by taking a history, doing a physical exam, and taking X-rays. If the cause is unclear, a CT scan (computerized tomography) of your abdomen and pelvis may be needed. °TREATMENT  °Treatment of the blockage depends on the cause and how bad the problem is.  °· Sometimes, the obstruction improves with bed rest and intravenous (IV) fluids. °· Resting the bowel is very important. This means following a simple diet. Sometimes, a clear liquid diet may be required for several days. °· Sometimes, a small tube (nasogastric tube) is placed into the stomach to decompress the bowel. When the bowel is blocked, it usually swells up like a balloon filled with air and fluids.  Decompression means that the air and fluids are removed by suction through that tube. This can help with pain, discomfort, and nausea. It can also help the obstruction resolve faster. °· Surgery may be required if other treatments do not work. Bowel obstruction from a hernia may require early surgery and can be an emergency procedure. Adhesions that cause frequent or severe obstructions may also require surgery. °HOME CARE INSTRUCTIONS °If your bowel obstruction is only partial or incomplete, you may be allowed to go home. °· Get plenty of rest. °· Follow your diet as directed by your caregiver. °· Only consume clear liquids until your condition improves. °· Avoid solid foods as instructed. °SEEK IMMEDIATE MEDICAL CARE IF: °· You have increased pain or cramping. °· You vomit blood. °· You have uncontrolled vomiting or nausea. °· You cannot drink fluids due to vomiting or pain. °· You develop confusion. °· You begin feeling very dry or thirsty (dehydrated). °· You have severe bloating. °· You have chills. °· You have a fever. °· You feel extremely weak or you faint. °MAKE SURE YOU: °· Understand these instructions. °· Will watch your condition. °· Will get help right away if you are not doing well or get worse. °Document Released: 12/12/2005 Document Revised: 12/18/2011 Document Reviewed: 12/09/2010 °ExitCare® Patient Information ©2015 [employer]. This information is not intended to replace advice given to you by your health care provider. Make sure you discuss any questions you have with your health care provider. ° °

## 2014-07-13 NOTE — Discharge Summary (Signed)
Physician Discharge Summary  Patient ID: Amy HarderChristina Klein MRN: [id number] DOB/AGE: 17-Nov-1975 38 y.o.  Admit date: 07/09/2014 Discharge date: 07/13/2014  Admission Diagnoses:   SBO  Hx of endometriosis/OOphorectomy  Hypertension  Discharge Diagnoses:  Same Active Problems:   SBO (small bowel obstruction)   PROCEDURES: none   Hospital Course: Patient is a 38 year old female with history of hypertension who presents to the emergency department for evaluation of abdominal pain. She reports that the abdominal pain began last night and worsened today. It was associated with vomiting. She has not had any diarrhea. Last bowel movement was yesterday. She describes her abdominal pain as a bloating feeling. PSH is significant for a right oophorectomy. No fevers, chills She was admitted and placed on bowel rest.  She did well and her diet was advanced.  By the day of discharge she was up to a soft diet and having Bowel movements.  She will follow up as needed.  Condition on d/c:  Improved          Disposition: Final discharge disposition not confirmed     Medication List         amLODipine 10 MG tablet  Commonly known as:  NORVASC  Take 10 mg by mouth daily.     carvedilol 12.5 MG tablet  Commonly known as:  COREG  Take 12.5 mg by mouth daily.     ibuprofen 200 MG tablet  Commonly known as:  ADVIL,MOTRIN  Take 400 mg by mouth every 6 (six) hours as needed for moderate pain.     multivitamin with minerals Tabs tablet  Take 1 tablet by mouth daily.           Follow-up Information   Follow up with Altamese CarolinaMARTIN,TANYA D, MD. (Call and let her know you had a problem and follow up for your blood pressure.)    Specialty:  Family Medicine   Contact information:   [address] [place] [address] [phone]       Schedule an appointment as soon as possible for a visit with [facility]ah Aguado H, MD. (As needed, if you have a problem.)    Specialty:  General  Surgery   Contact information:   [address] [place] [address] [phone]       Signed: Sherrie GeorgeJENNINGS,WILLARD 07/13/2014, 2:59 PM  Agree with above.  Ovidio Kinavid Neal Trulson, MD, [facility] Pager: [phone] Office phone:  [phone]

## 2014-07-13 NOTE — Progress Notes (Addendum)
  Subjective: Feels much better, she has had a couple laparotomies for endometriosis.   Last laparotomy for right oophorectomy about 2012.  This is her first SBO.  Objective: Vital signs in last 24 hours: Temp:  [98 F (36.7 C)-98.6 F (37 C)] 98 F (36.7 C) (10/05 0601) Pulse Rate:  [65-80] 65 (10/05 0601) Resp:  [16-18] 17 (10/05 0601) BP: (123-127)/(74-77) 124/74 mmHg (10/05 0601) SpO2:  [98 %-99 %] 98 % (10/05 0601) Last BM Date: 07/10/14 TM 99 VSS 1200 PO recorded 1 stool No labs last K+ 3.3 Intake/Output from previous day: 10/04 0701 - 10/05 0700 In: 1400 [P.O.:1200; I.V.:200] Out: 2100 [Urine:2100] Intake/Output this shift:    General appearance: alert, cooperative and no distress Resp: clear to auscultation bilaterally GI: soft, non-tender; bowel sounds normal; no masses,  no organomegaly  Lab Results:  No results found for this basename: WBC, HGB, HCT, PLT,  in the last 72 hours  BMET  Recent Labs  07/11/14 0540  NA 140  K 3.3*  CL 101  CO2 26  GLUCOSE 88  BUN 5*  CREATININE 0.55  CALCIUM 8.6   PT/INR No results found for this basename: LABPROT, INR,  in the last 72 hours   Recent Labs Lab 07/09/14 0129  AST 29  ALT 61*  ALKPHOS 104  BILITOT 0.6  PROT 8.7*  ALBUMIN 4.0     Lipase     Component Value Date/Time   LIPASE 20 07/09/2014 0129     Studies/Results: Dg Abd 2 Views  07/11/2014   CLINICAL DATA:  Evaluate for small bowel obstruction. Abdominal soreness.  EXAM: ABDOMEN - 2 VIEW  COMPARISON:  One day prior and CT of 2 days prior  FINDINGS: Nasogastric tube terminates at distal stomach. The upright film demonstrates no free intraperitoneal air. Air-fluid levels within mid small bowel loops. Slightly less small bowel distention, 4.5 cm today versus 5.2 cm on the prior. Contrast within the colon.  IMPRESSION: Minimal improvement in partial small bowel obstruction. No free intraperitoneal air or other acute complication.   Electronically  Signed   By: Jeronimo GreavesKyle  Talbot M.D.   On: 07/11/2014 10:37    Medications: . amLODipine  10 mg Oral Daily  . carvedilol  12.5 mg Oral QAC breakfast  . enoxaparin (LOVENOX) injection  40 mg Subcutaneous Daily    Prior to Admission medications   Medication Sig Start Date End Date Taking? Authorizing Provider  amLODipine (NORVASC) 10 MG tablet Take 10 mg by mouth daily.   Yes Historical Provider, MD  carvedilol (COREG) 12.5 MG tablet Take 12.5 mg by mouth daily.   Yes Historical Provider, MD  ibuprofen (ADVIL,MOTRIN) 200 MG tablet Take 400 mg by mouth every 6 (six) hours as needed for moderate pain.   Yes Historical Provider, MD  Multiple Vitamin (MULTIVITAMIN WITH MINERALS) TABS tablet Take 1 tablet by mouth daily.   Yes Historical Provider, MD     Assessment/Plan SBO Hx of endometriosis/OOphorectomy Hypertension   Plan:  Soft diet, and home either later today or in AM.   LOS: 4 days    JENNINGS,WILLARD 07/13/2014  Agree with above. It looks like she could probably go home later today. She is feeling better, tolerating PO soft diet well, she would like to go home this evening after her husband gets off work. - WJ  Ovidio Kinavid Hanzel Pizzo, MD, [facility] Pager: [phone] Office phone:  [phone]

## 2014-07-30 ENCOUNTER — Encounter: Payer: Self-pay | Admitting: Gastroenterology

## 2014-09-17 ENCOUNTER — Other Ambulatory Visit (HOSPITAL_COMMUNITY): Payer: Self-pay | Admitting: Obstetrics and Gynecology

## 2014-09-17 DIAGNOSIS — Z3141 Encounter for fertility testing: Secondary | ICD-10-CM

## 2014-09-23 ENCOUNTER — Encounter (INDEPENDENT_AMBULATORY_CARE_PROVIDER_SITE_OTHER): Payer: Self-pay

## 2014-09-23 ENCOUNTER — Ambulatory Visit (HOSPITAL_COMMUNITY)
Admission: RE | Admit: 2014-09-23 | Discharge: 2014-09-23 | Disposition: A | Discharge status: Home / Self Care | Payer: BC Managed Care – PPO | Source: Ambulatory Visit | Attending: Obstetrics and Gynecology | Admitting: Obstetrics and Gynecology

## 2014-09-23 DIAGNOSIS — N979 Female infertility, unspecified: Secondary | ICD-10-CM | POA: Insufficient documentation

## 2014-09-23 DIAGNOSIS — Z3141 Encounter for fertility testing: Secondary | ICD-10-CM

## 2014-09-23 MED ORDER — IOHEXOL 300 MG/ML  SOLN
20.0000 mL | Freq: Once | INTRAMUSCULAR | Status: AC | PRN
  Administered 2014-09-23: 20 mL

## 2014-09-30 ENCOUNTER — Ambulatory Visit: Payer: BC Managed Care – PPO | Admitting: Gastroenterology

## 2015-04-02 IMAGING — CT CT ABD-PELV W/ CM
1 of 2 series · 15 of 32 positions shown, 19 images · IV contrast (100 ML OMNI 300)
Comparison: None.

CLINICAL DATA: Nausea and vomiting for 1 day. Elevated white cell
count. Microhematuria.

EXAM:
CT ABDOMEN AND PELVIS WITH CONTRAST
TECHNIQUE: Multidetector CT imaging of the abdomen and pelvis was performed
using the standard protocol following bolus administration of
intravenous contrast.
CONTRAST:  100mL OMNIPAQUE IOHEXOL 300 MG/ML  SOLN

[Series 2: abd/pel with · axial · 0.72mm/px · z∈[+1184,+1594]mm · 15 of 90 slices shown, 19 images]
[im 4/90  soft-tissue]
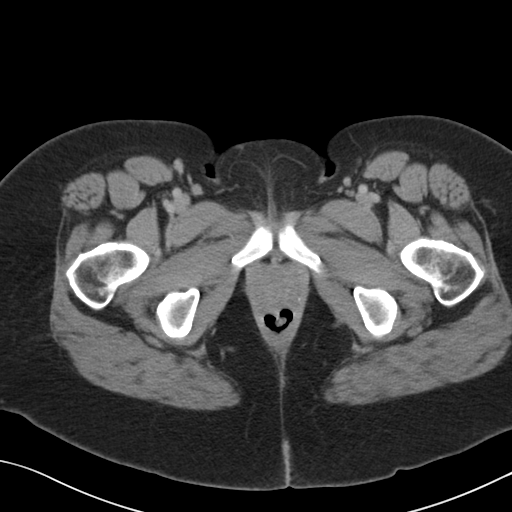
[im 4/90  bone]
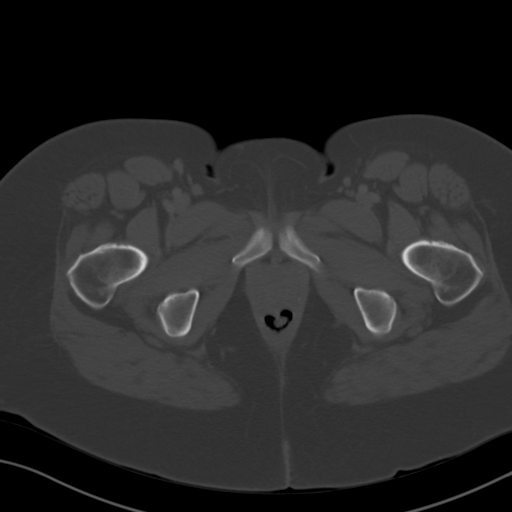
[im 11/90  soft-tissue]
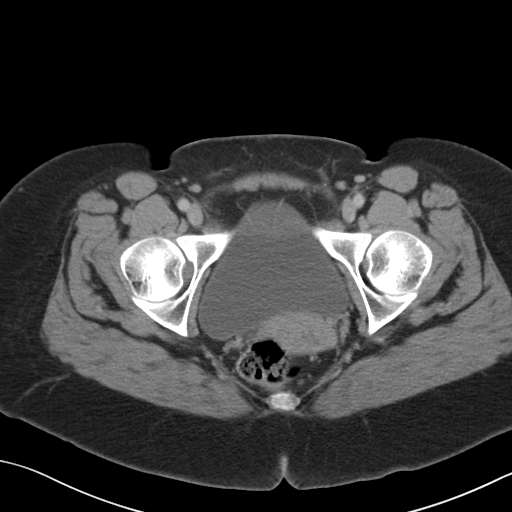
[im 18/90  soft-tissue]
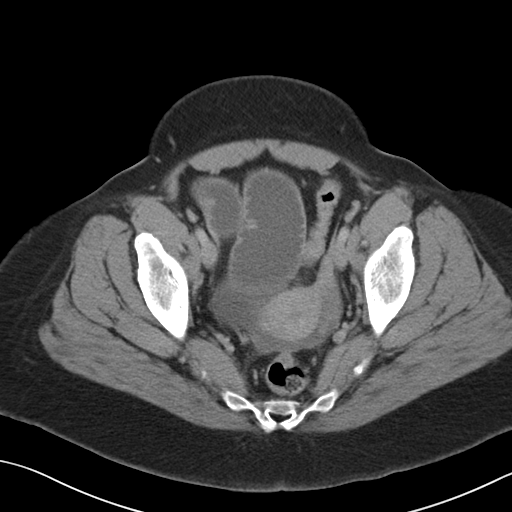
[im 24/90  soft-tissue]
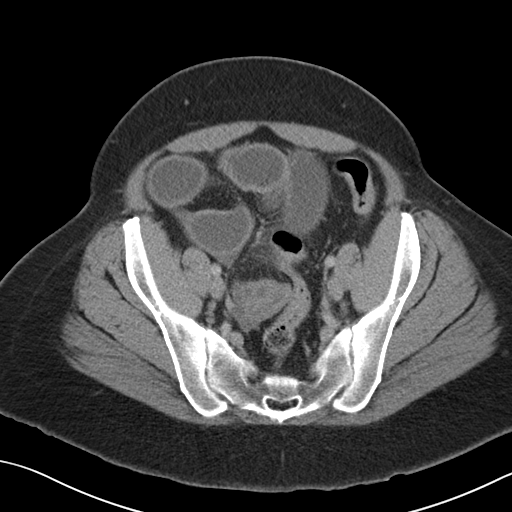
[im 31/90  soft-tissue]
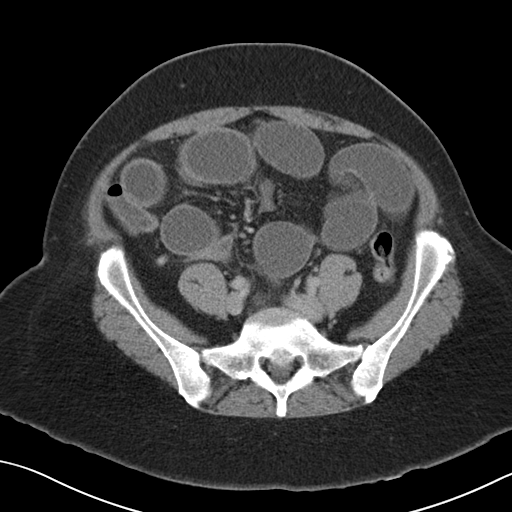
[im 38/90  soft-tissue]
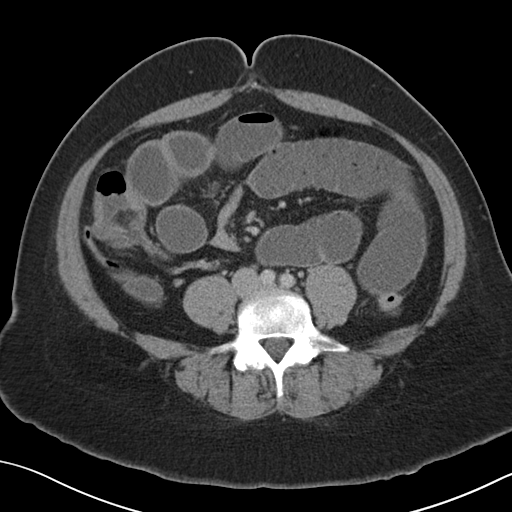
[im 45/90  soft-tissue]
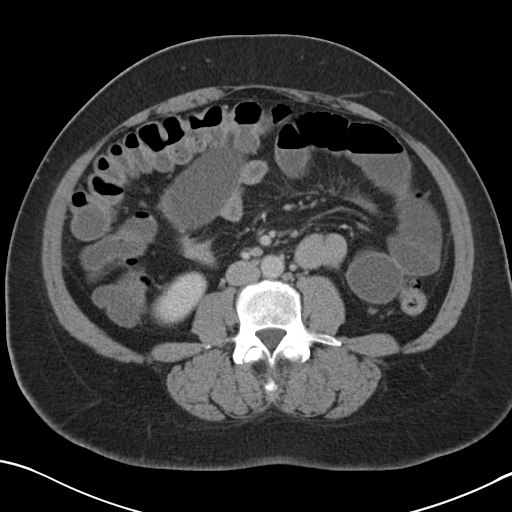
[im 52/90  soft-tissue]
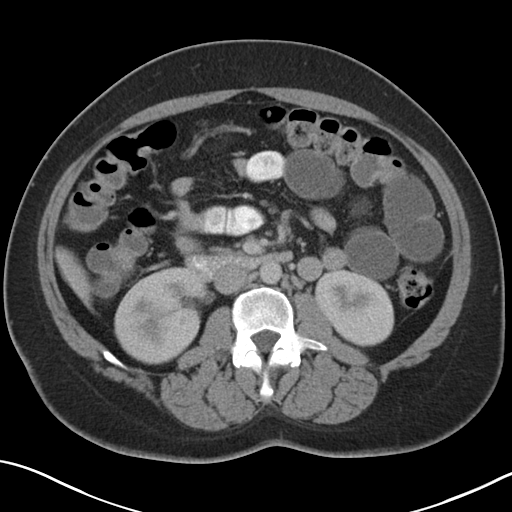
[im 59/90  soft-tissue]
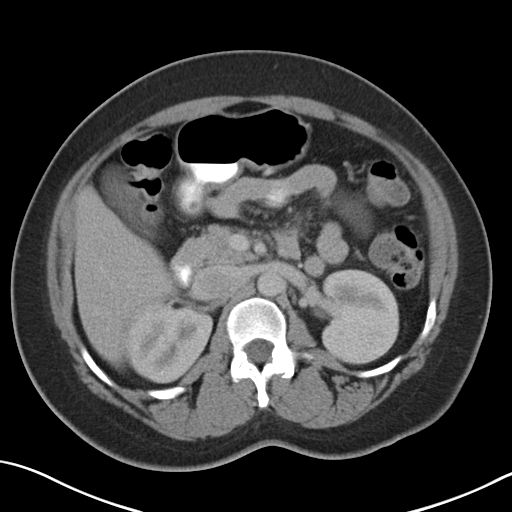
[im 59/90  bone]
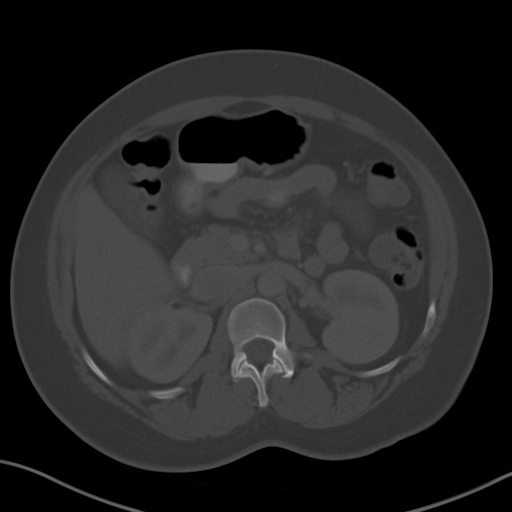
[im 66/90  soft-tissue]
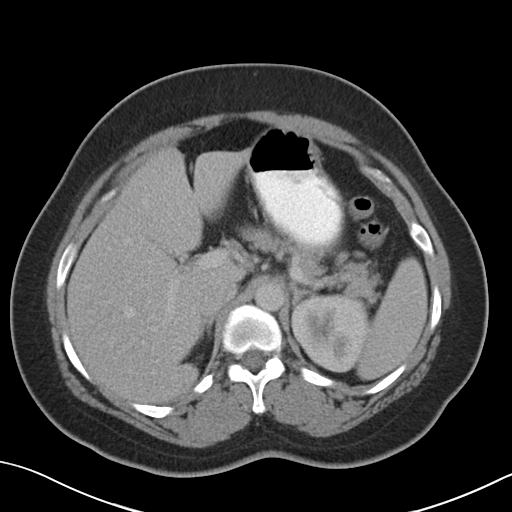
[im 72/90  soft-tissue]
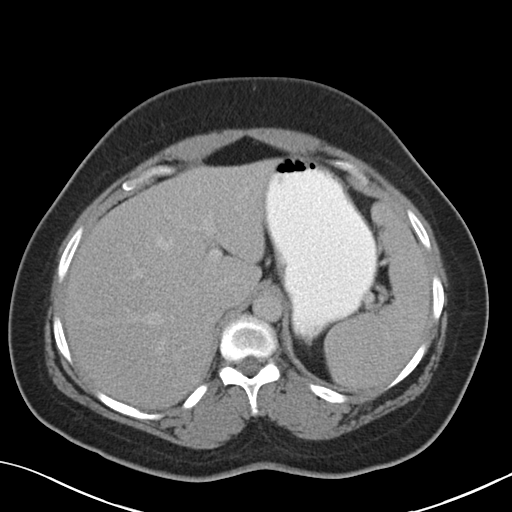
[im 76/90  lung]
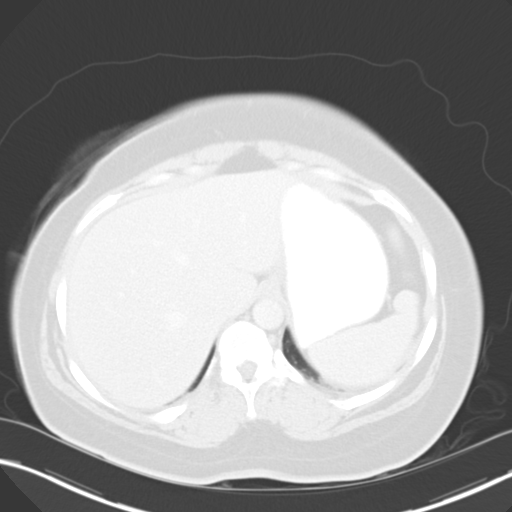
[im 79/90  soft-tissue]
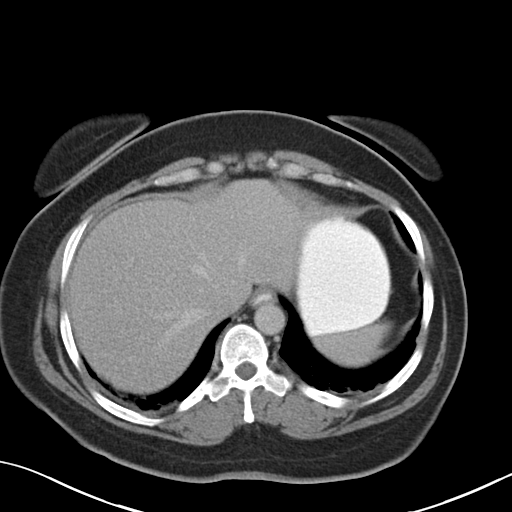
[im 79/90  lung]
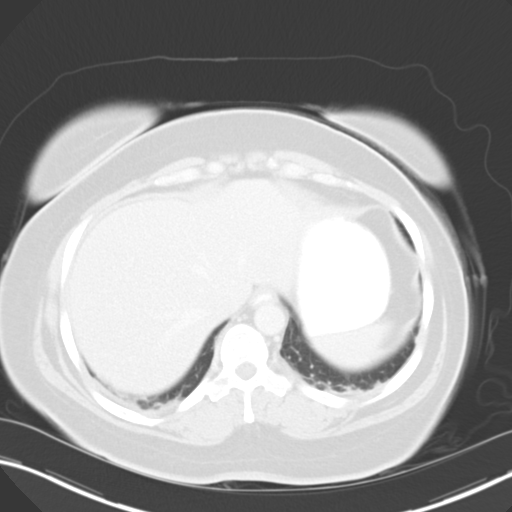
[im 83/90  lung]
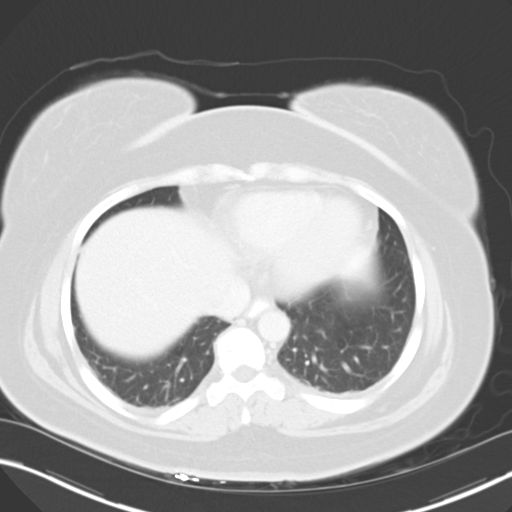
[im 86/90  soft-tissue]
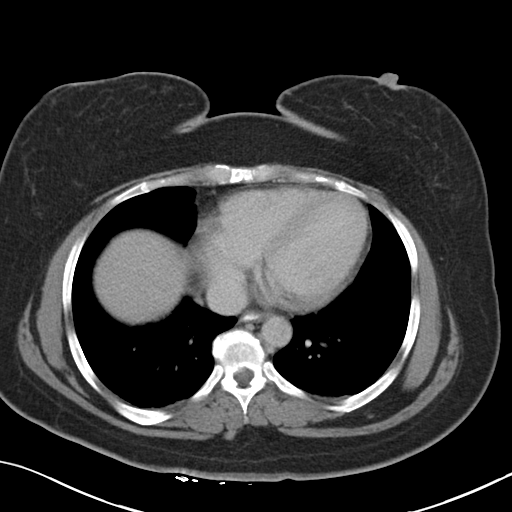
[im 86/90  lung]
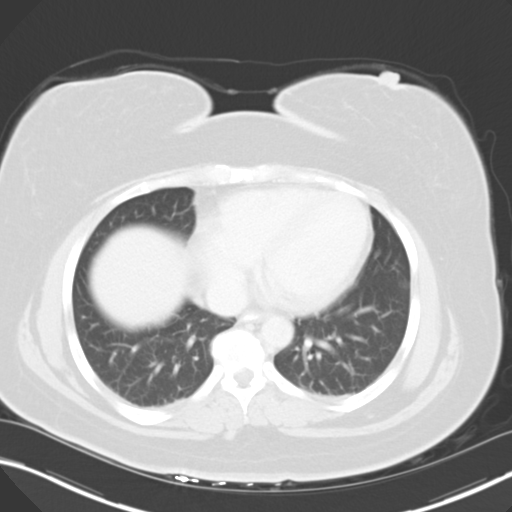

[15 of 32 positions shown; findings below may reference images not displayed]

FINDINGS: Mild atelectasis in the lung bases. Residual contrast material in
the lower esophagus may indicate reflux or decreased motility.

The liver, spleen, gallbladder, pancreas, adrenal glands, kidneys,
abdominal aorta, inferior vena cava, and retroperitoneal lymph nodes
are unremarkable. Stomach is normal. There is proximal small bowel
dilatation with distention of fluid filled loops and decompression
of the distal small bowel. Transition zone appears to be in the mid
pelvis. Appearance is consistent with small bowel obstruction. There
is a vague soft tissue chains demonstrated at the transition zone
which may indicate mass or endometriosis. Small amount of free fluid
in the mesenteries and pelvic gutters. Colon is stool filled without
abnormal distention. No free air in the abdomen.

Pelvis: Appendix is normal. Bladder wall is not thickened. Uterus
and ovaries are not enlarged. No destructive bone lesions. There is
mild small bowel wall thickening suggesting inflammation or
enteritis.
IMPRESSION: Small bowel obstruction with transition zone in the mid pelvis.
Suggestion of enhancing tissue suggesting possible mass or
endometriosis at the level of obstruction. Mild small bowel wall
thickening and mesenteric fluid are likely reactive or inflammatory.

## 2015-04-03 IMAGING — CR DG ABDOMEN 2V
2 series · 2 of 2 positions shown · non-contrast
Comparison: 07/09/2014

CLINICAL DATA: Small-bowel obstruction with distended abdomen

EXAM:
ABDOMEN - 2 VIEW

[w abdomen upright *]
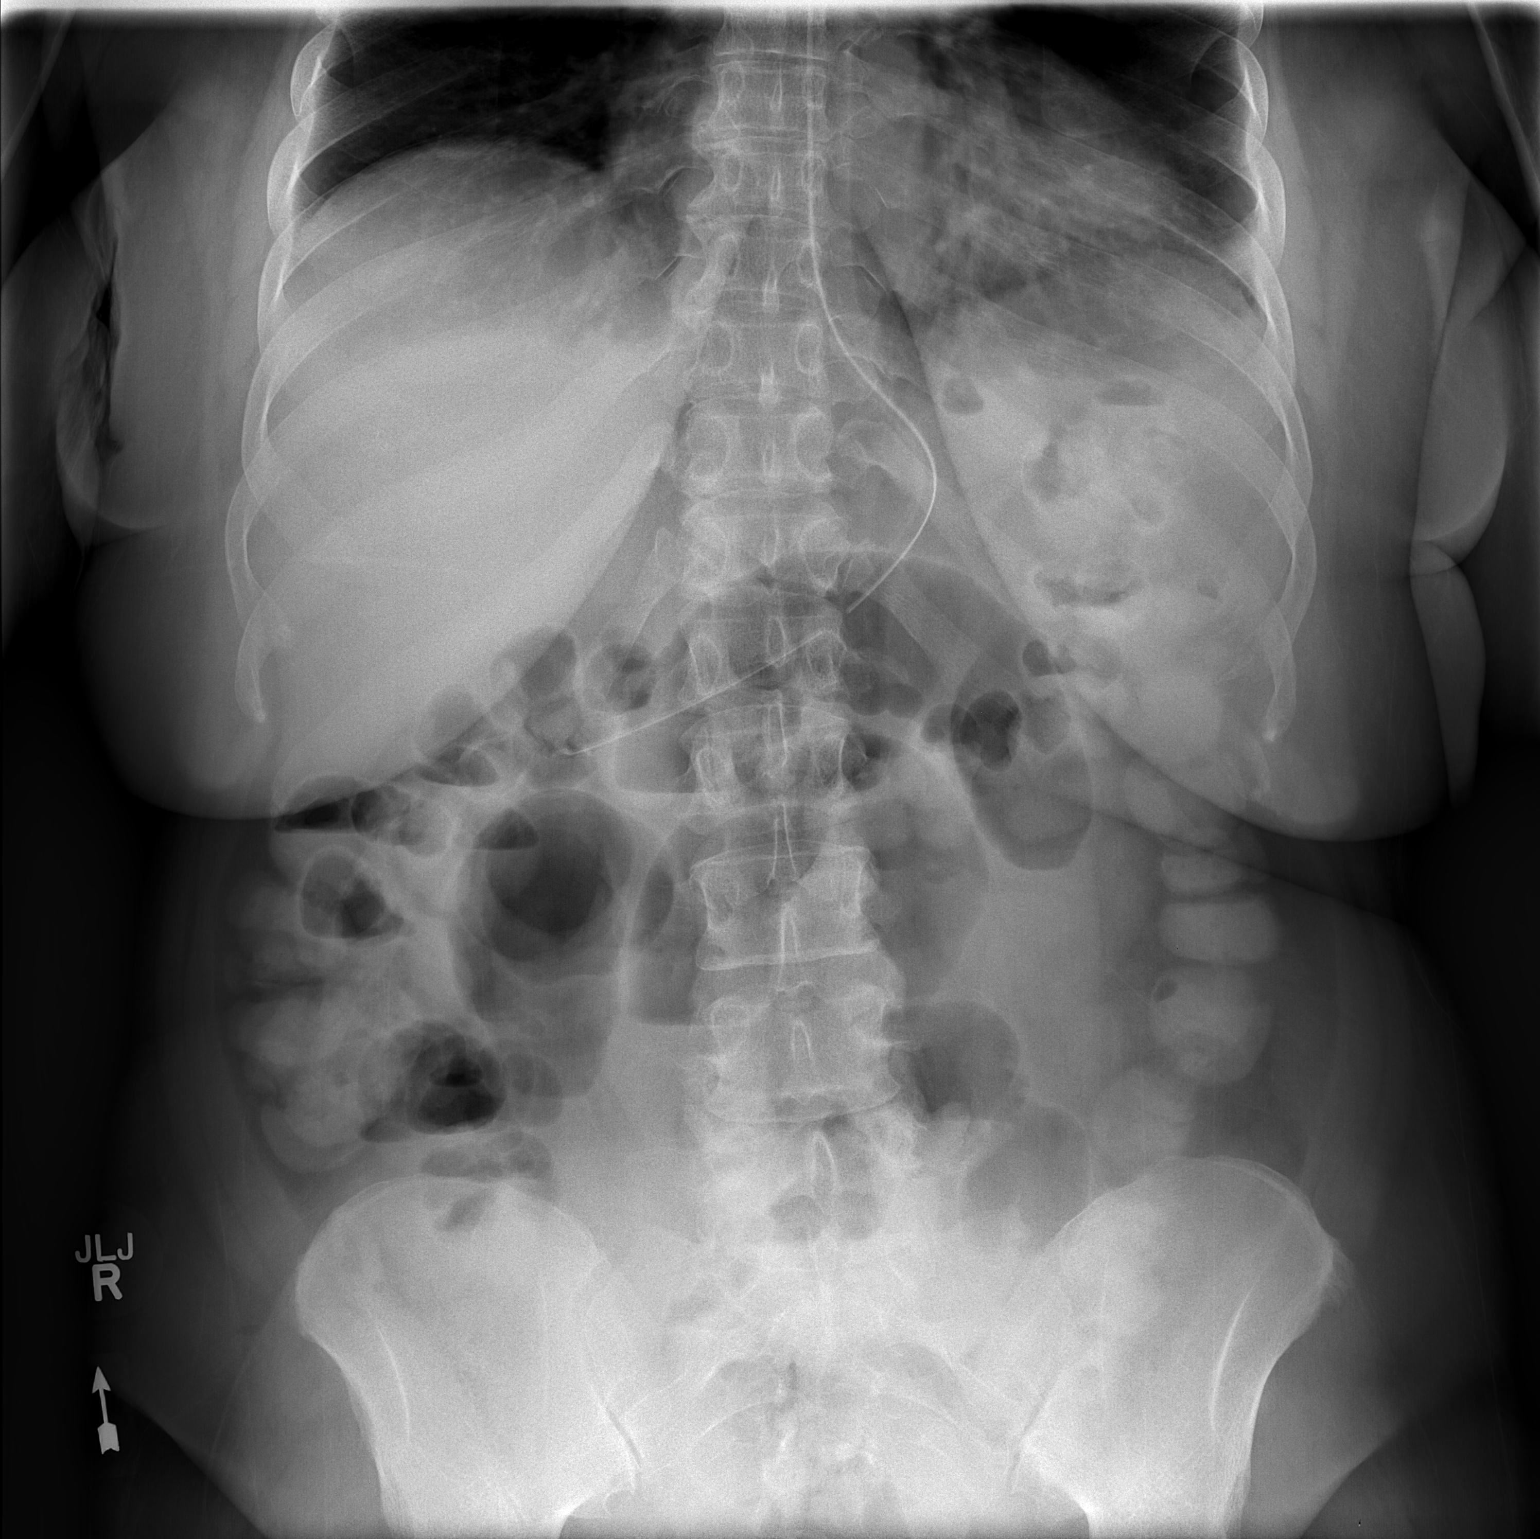

[t abdomen supine]
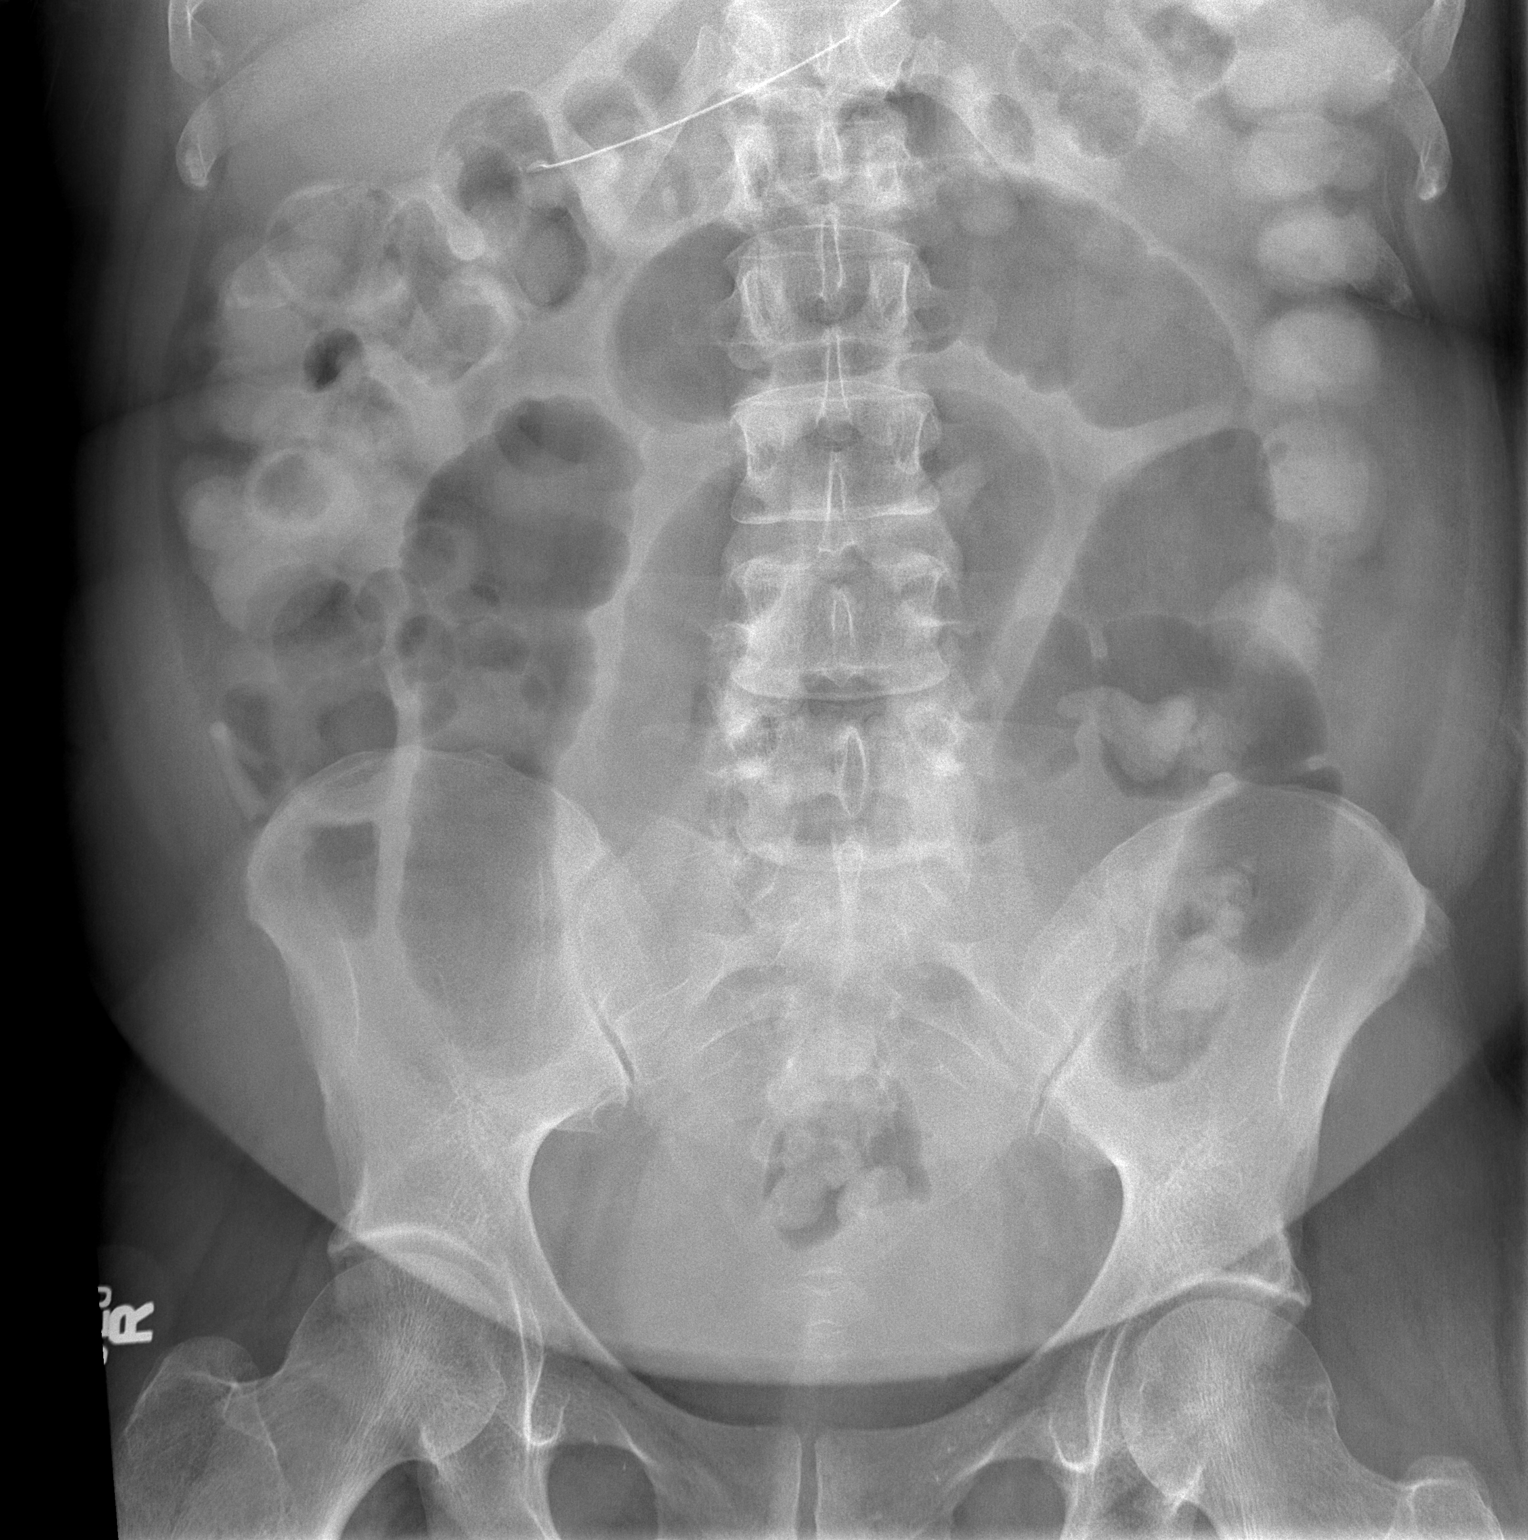

[2 of 2 positions shown; findings below may reference images not displayed]

FINDINGS: A nasogastric catheter is now in place within the stomach. There
remains multiple dilated loops of small bowel with air-fluid levels.
Contrast material is now noted within the colon suggesting this is a
partial small bowel obstruction. No free air is seen. No abnormal
mass or abnormal calcifications are noted.
IMPRESSION: Persistent small bowel obstruction. The overall appearance is stable
from the prior exam. Nasogastric catheter in adequate position.

## 2016-04-14 ENCOUNTER — Encounter (HOSPITAL_COMMUNITY): Payer: Self-pay | Admitting: Emergency Medicine

## 2016-04-14 ENCOUNTER — Inpatient Hospital Stay (HOSPITAL_COMMUNITY)
Admission: EM | Admit: 2016-04-14 | Discharge: 2016-04-18 | DRG: 638 | Disposition: A | Discharge status: Home / Self Care | Payer: BLUE CROSS/BLUE SHIELD | Attending: Internal Medicine | Admitting: Internal Medicine

## 2016-04-14 ENCOUNTER — Emergency Department (HOSPITAL_COMMUNITY): Payer: BLUE CROSS/BLUE SHIELD

## 2016-04-14 DIAGNOSIS — F329 Major depressive disorder, single episode, unspecified: Secondary | ICD-10-CM | POA: Diagnosis present

## 2016-04-14 DIAGNOSIS — E1110 Type 2 diabetes mellitus with ketoacidosis without coma: Secondary | ICD-10-CM | POA: Diagnosis present

## 2016-04-14 DIAGNOSIS — I10 Essential (primary) hypertension: Secondary | ICD-10-CM | POA: Diagnosis not present

## 2016-04-14 DIAGNOSIS — E860 Dehydration: Secondary | ICD-10-CM | POA: Diagnosis present

## 2016-04-14 DIAGNOSIS — E871 Hypo-osmolality and hyponatremia: Secondary | ICD-10-CM | POA: Diagnosis not present

## 2016-04-14 DIAGNOSIS — E1310 Other specified diabetes mellitus with ketoacidosis without coma: Secondary | ICD-10-CM | POA: Diagnosis not present

## 2016-04-14 DIAGNOSIS — R000 Tachycardia, unspecified: Secondary | ICD-10-CM | POA: Diagnosis present

## 2016-04-14 DIAGNOSIS — E861 Hypovolemia: Secondary | ICD-10-CM | POA: Diagnosis present

## 2016-04-14 DIAGNOSIS — E876 Hypokalemia: Secondary | ICD-10-CM | POA: Diagnosis present

## 2016-04-14 DIAGNOSIS — F419 Anxiety disorder, unspecified: Secondary | ICD-10-CM | POA: Diagnosis present

## 2016-04-14 DIAGNOSIS — R631 Polydipsia: Secondary | ICD-10-CM | POA: Diagnosis present

## 2016-04-14 LAB — BLOOD GAS, VENOUS
Acid-base deficit: 12.2 mmol/L — ABNORMAL HIGH (ref 0.0–2.0)
BICARBONATE: 13.8 meq/L — AB (ref 20.0–24.0)
O2 Saturation: 51.3 %
PH VEN: 7.246 — AB (ref 7.250–7.300)
Patient temperature: 98.6
TCO2: 12.7 mmol/L (ref 0–100)
pCO2, Ven: 33 mmHg — ABNORMAL LOW (ref 45.0–50.0)
pO2, Ven: 32.9 mmHg (ref 31.0–45.0)

## 2016-04-14 LAB — URINALYSIS, ROUTINE W REFLEX MICROSCOPIC
BILIRUBIN URINE: NEGATIVE
Hgb urine dipstick: NEGATIVE
Ketones, ur: >80 mg/dL — AB
Leukocytes, UA: NEGATIVE
Nitrite: NEGATIVE
Protein, ur: NEGATIVE mg/dL
SPECIFIC GRAVITY, URINE: 1.022 (ref 1.005–1.030)
pH: 5 (ref 5.0–8.0)

## 2016-04-14 LAB — MRSA PCR SCREENING: MRSA BY PCR: NEGATIVE

## 2016-04-14 LAB — BASIC METABOLIC PANEL
ANION GAP: 21 — AB (ref 5–15)
ANION GAP: 14 (ref 5–15)
ANION GAP: 9 (ref 5–15)
BUN: 16 mg/dL (ref 6–20)
BUN: 13 mg/dL (ref 6–20)
BUN: 9 mg/dL (ref 6–20)
CALCIUM: 8.1 mg/dL — AB (ref 8.9–10.3)
CHLORIDE: 108 mmol/L (ref 101–111)
CHLORIDE: 109 mmol/L (ref 101–111)
CO2: 12 mmol/L — ABNORMAL LOW (ref 22–32)
CO2: 12 mmol/L — AB (ref 22–32)
CO2: 16 mmol/L — AB (ref 22–32)
CREATININE: 0.61 mg/dL (ref 0.44–1.00)
Calcium: 9.4 mg/dL (ref 8.9–10.3)
Calcium: 8 mg/dL — ABNORMAL LOW (ref 8.9–10.3)
Chloride: 93 mmol/L — ABNORMAL LOW (ref 101–111)
Creatinine, Ser: 1.08 mg/dL — ABNORMAL HIGH (ref 0.44–1.00)
Creatinine, Ser: 0.83 mg/dL (ref 0.44–1.00)
GFR calc non Af Amer: >60 mL/min (ref >60)
GFR calc non Af Amer: >60 mL/min (ref >60)
GFR calc non Af Amer: >60 mL/min (ref >60)
GLUCOSE: 260 mg/dL — AB (ref 65–99)
Glucose, Bld: 429 mg/dL — ABNORMAL HIGH (ref 65–99)
Glucose, Bld: 192 mg/dL — ABNORMAL HIGH (ref 65–99)
POTASSIUM: 3.4 mmol/L — AB (ref 3.5–5.1)
POTASSIUM: 3.4 mmol/L — AB (ref 3.5–5.1)
Potassium: 3.7 mmol/L (ref 3.5–5.1)
Sodium: 126 mmol/L — ABNORMAL LOW (ref 135–145)
Sodium: 134 mmol/L — ABNORMAL LOW (ref 135–145)
Sodium: 134 mmol/L — ABNORMAL LOW (ref 135–145)

## 2016-04-14 LAB — CBC
HCT: 42.7 % (ref 36.0–46.0)
Hemoglobin: 15.5 g/dL — ABNORMAL HIGH (ref 12.0–15.0)
MCH: 26.3 pg (ref 26.0–34.0)
MCHC: 36.3 g/dL — ABNORMAL HIGH (ref 30.0–36.0)
MCV: 72.5 fL — AB (ref 78.0–100.0)
PLATELETS: 484 10*3/uL — AB (ref 150–400)
RBC: 5.89 MIL/uL — AB (ref 3.87–5.11)
RDW: 14.3 % (ref 11.5–15.5)
WBC: 9.9 10*3/uL (ref 4.0–10.5)

## 2016-04-14 LAB — URINE MICROSCOPIC-ADD ON

## 2016-04-14 LAB — I-STAT BETA HCG BLOOD, ED (MC, WL, AP ONLY): I-stat hCG, quantitative: <5 m[IU]/mL (ref <5)

## 2016-04-14 LAB — CBG MONITORING, ED: Glucose-Capillary: 443 mg/dL — ABNORMAL HIGH (ref 65–99)

## 2016-04-14 MED ORDER — DOCUSATE SODIUM 100 MG PO CAPS
100.0000 mg | ORAL_CAPSULE | Freq: Two times a day (BID) | ORAL | Status: DC
  Administered 2016-04-14 – 2016-04-18 (×3): 100 mg via ORAL
  Filled 2016-04-14 (×6): qty 1

## 2016-04-14 MED ORDER — ONDANSETRON HCL 4 MG PO TABS: 4.0000 mg | ORAL_TABLET | Freq: Four times a day (QID) | ORAL | Status: DC | PRN

## 2016-04-14 MED ORDER — POTASSIUM CHLORIDE CRYS ER 20 MEQ PO TBCR
20.0000 meq | EXTENDED_RELEASE_TABLET | Freq: Once | ORAL | Status: AC
  Administered 2016-04-14: 20 meq via ORAL
  Filled 2016-04-14: qty 1

## 2016-04-14 MED ORDER — SODIUM CHLORIDE 0.9% FLUSH
3.0000 mL | Freq: Two times a day (BID) | INTRAVENOUS | Status: DC
  Administered 2016-04-14 – 2016-04-16 (×3): 3 mL via INTRAVENOUS

## 2016-04-14 MED ORDER — ACETAMINOPHEN 500 MG PO TABS: 500.0000 mg | ORAL_TABLET | Freq: Four times a day (QID) | ORAL | Status: DC | PRN

## 2016-04-14 MED ORDER — DEXTROSE-NACL 5-0.45 % IV SOLN
INTRAVENOUS | Status: DC
  Administered 2016-04-14 – 2016-04-15 (×2): via INTRAVENOUS

## 2016-04-14 MED ORDER — SODIUM CHLORIDE 0.9 % IV BOLUS (SEPSIS)
1000.0000 mL | Freq: Once | INTRAVENOUS | Status: AC
  Administered 2016-04-14: 1000 mL via INTRAVENOUS

## 2016-04-14 MED ORDER — POTASSIUM CHLORIDE CRYS ER 20 MEQ PO TBCR
40.0000 meq | EXTENDED_RELEASE_TABLET | Freq: Once | ORAL | Status: AC
  Administered 2016-04-14: 40 meq via ORAL
  Filled 2016-04-14: qty 2

## 2016-04-14 MED ORDER — ONDANSETRON HCL 4 MG/2ML IJ SOLN: 4.0000 mg | Freq: Four times a day (QID) | INTRAMUSCULAR | Status: DC | PRN

## 2016-04-14 MED ORDER — SODIUM CHLORIDE 0.9 % IV SOLN
INTRAVENOUS | Status: DC
  Administered 2016-04-14 – 2016-04-15 (×3): 2.5 [IU]/h via INTRAVENOUS
  Filled 2016-04-14: qty 2.5

## 2016-04-14 MED ORDER — SODIUM CHLORIDE 0.9 % IV SOLN
INTRAVENOUS | Status: DC
  Administered 2016-04-14: 3.7 [IU]/h via INTRAVENOUS
  Filled 2016-04-14: qty 2.5

## 2016-04-14 MED ORDER — SODIUM CHLORIDE 0.9 % IV SOLN
INTRAVENOUS | Status: DC
  Administered 2016-04-14 – 2016-04-16 (×4): via INTRAVENOUS

## 2016-04-14 MED ORDER — ENOXAPARIN SODIUM 40 MG/0.4ML ~~LOC~~ SOLN
40.0000 mg | SUBCUTANEOUS | Status: DC
  Administered 2016-04-14 – 2016-04-17 (×4): 40 mg via SUBCUTANEOUS
  Filled 2016-04-14 (×4): qty 0.4

## 2016-04-14 MED ORDER — HYDRALAZINE HCL 20 MG/ML IJ SOLN: 10.0000 mg | Freq: Four times a day (QID) | INTRAMUSCULAR | Status: DC | PRN

## 2016-04-14 MED ORDER — SODIUM CHLORIDE 0.9 % IV BOLUS (SEPSIS)
500.0000 mL | Freq: Once | INTRAVENOUS | Status: AC
  Administered 2016-04-14: 500 mL via INTRAVENOUS

## 2016-04-14 MED ORDER — ACETAMINOPHEN 650 MG RE SUPP: 650.0000 mg | Freq: Four times a day (QID) | RECTAL | Status: DC | PRN

## 2016-04-14 MED ORDER — POTASSIUM CHLORIDE 10 MEQ/100ML IV SOLN
10.0000 meq | INTRAVENOUS | Status: AC
  Administered 2016-04-14 (×2): 10 meq via INTRAVENOUS
  Filled 2016-04-14 (×2): qty 100

## 2016-04-14 MED ORDER — ACETAMINOPHEN 325 MG PO TABS
650.0000 mg | ORAL_TABLET | Freq: Four times a day (QID) | ORAL | Status: DC | PRN
  Administered 2016-04-17 (×2): 650 mg via ORAL
  Filled 2016-04-14 (×2): qty 2

## 2016-04-14 NOTE — H&P (Signed)
History and Physical    Amy Klein ZDG:[id number] DOB: Apr 12, 1976 DOA: 04/14/2016  PCP: Lujean Amel, MD  Patient coming from: Home   Chief Complaint: Polyuria, polydipsia.    HPI: Amy Klein is a 40 y.o. female with medical history significant of HTN, pre diabetic who was refer to ED by PCP for evaluation of hyperglycemia. Patient went to see her PCP today because she has not been feeling well for last 2 weeks. She report fatigue, dyspnea on exertion, polyuria, polydipsia. She was weight today at her PCP office and she has lost 20 pounds since may.  She denies abdominal pain,nausea,vomiting, chest pain.   ED Course: Presents hyperglycemia, DKA, PH 7.2, CBG 429, bicarb 12, sodium 126. HR 144.   Review of Systems: As per HPI otherwise 10 point review of systems negative.    Past Medical History  Diagnosis Date  . Hypertension     Past Surgical History  Procedure Laterality Date  . Carpal tunnel release    . Oophorectomy Right      reports that she has never smoked. She has never used smokeless tobacco. She reports that she does not drink alcohol or use illicit drugs.  Allergies  Allergen Reactions  . Lactose Intolerance (Gi)     Gas, stomach discomfort  . Sulfa Antibiotics Hives    Family History; Mother, father sister, grandparents with diabetes.   Prior to Admission medications   Medication Sig Start Date End Date Taking? Authorizing Provider  acetaminophen (TYLENOL) 500 MG tablet Take 500-1,000 mg by mouth every 6 (six) hours as needed for moderate pain or headache.   Yes Historical Provider, MD  ALPRAZolam Duanne Moron) 0.25 MG tablet Take 0.25 mg by mouth daily as needed for anxiety.   Yes Historical Provider, MD  Cholecalciferol (VITAMIN D PO) Take 1 capsule by mouth daily.   Yes Historical Provider, MD  cyclobenzaprine (FLEXERIL) 10 MG tablet Take 10 mg by mouth 2 (two) times daily as needed for muscle spasms (neck pain/tension headaches).   Yes Historical  Provider, MD  gabapentin (NEURONTIN) 100 MG capsule Take 100 mg by mouth 3 (three) times daily.   Yes Historical Provider, MD  GARLIC PO Take 1 capsule by mouth daily.   Yes Historical Provider, MD  hydrochlorothiazide (HYDRODIURIL) 25 MG tablet Take 25 mg by mouth daily.   Yes Historical Provider, MD  ibuprofen (ADVIL,MOTRIN) 200 MG tablet Take 400 mg by mouth every 6 (six) hours as needed for moderate pain.   Yes Historical Provider, MD  Lactase (LACTAID PO) Take 1 tablet by mouth daily as needed (lactos allergy).   Yes Historical Provider, MD  Multiple Vitamin (MULTIVITAMIN WITH MINERALS) TABS tablet Take 1 tablet by mouth daily.   Yes Historical Provider, MD  Simethicone (GAS-X PO) Take 1 tablet by mouth daily as needed (gas).   Yes Historical Provider, MD    Physical Exam: Filed Vitals:   04/14/16 1500 04/14/16 1546 04/14/16 1600 04/14/16 1630  BP: 132/92 134/108 130/97 142/86  Pulse: 107 115 109 100  Temp:      TempSrc:      Resp: 19 18    Height:      Weight:      SpO2: 98% 100% 100% 100%      Constitutional: NAD, calm, comfortable Filed Vitals:   04/14/16 1500 04/14/16 1546 04/14/16 1600 04/14/16 1630  BP: 132/92 134/108 130/97 142/86  Pulse: 107 115 109 100  Temp:      TempSrc:      Resp:  19 18    Height:      Weight:      SpO2: 98% 100% 100% 100%   Eyes: PERRL, lids and conjunctivae normal ENMT: Mucous membranes are moist. Posterior pharynx clear of any exudate or lesions.Normal dentition.  Neck: normal, supple, no masses, no thyromegaly Respiratory: clear to auscultation bilaterally, no wheezing, no crackles. Normal respiratory effort. No accessory muscle use.  Cardiovascular: Regular rate and rhythm, no murmurs / rubs / gallops. No extremity edema. 2+ pedal pulses. No carotid bruits.  Abdomen: no tenderness, no masses palpated. No hepatosplenomegaly. Bowel sounds positive.  Musculoskeletal: no clubbing / cyanosis. No joint deformity upper and lower extremities.  Good ROM, no contractures. Normal muscle tone.  Skin: no rashes, lesions, ulcers. No induration Neurologic: CN 2-12 grossly intact. Sensation intact, DTR normal. Strength 5/5 in all 4.  Psychiatric: Normal judgment and insight. Alert and oriented x 3. Normal mood.    Labs on Admission: I have personally reviewed following labs and imaging studies  CBC:  Recent Labs Lab 04/14/16 1428  WBC 9.9  HGB 15.5*  HCT 42.7  MCV 72.5*  PLT 374*   Basic Metabolic Panel:  Recent Labs Lab 04/14/16 1428  NA 126*  K 3.7  CL 93*  CO2 12*  GLUCOSE 429*  BUN 16  CREATININE 1.08*  CALCIUM 9.4   GFR: Estimated Creatinine Clearance: 80 mL/min (by C-G formula based on Cr of 1.08). Liver Function Tests: No results for input(s): AST, ALT, ALKPHOS, BILITOT, PROT, ALBUMIN in the last 168 hours. No results for input(s): LIPASE, AMYLASE in the last 168 hours. No results for input(s): AMMONIA in the last 168 hours. Coagulation Profile: No results for input(s): INR, PROTIME in the last 168 hours. Cardiac Enzymes: No results for input(s): CKTOTAL, CKMB, CKMBINDEX, TROPONINI in the last 168 hours. BNP (last 3 results) No results for input(s): PROBNP in the last 8760 hours. HbA1C: No results for input(s): HGBA1C in the last 72 hours. CBG:  Recent Labs Lab 04/14/16 1355  GLUCAP 443*   Lipid Profile: No results for input(s): CHOL, HDL, LDLCALC, TRIG, CHOLHDL, LDLDIRECT in the last 72 hours. Thyroid Function Tests: No results for input(s): TSH, T4TOTAL, FREET4, T3FREE, THYROIDAB in the last 72 hours. Anemia Panel: No results for input(s): VITAMINB12, FOLATE, FERRITIN, TIBC, IRON, RETICCTPCT in the last 72 hours. Urine analysis:    Component Value Date/Time   COLORURINE YELLOW 04/14/2016 1527   APPEARANCEUR CLEAR 04/14/2016 1527   LABSPEC 1.022 04/14/2016 1527   PHURINE 5.0 04/14/2016 1527   GLUCOSEU >1000* 04/14/2016 1527   HGBUR NEGATIVE 04/14/2016 1527   BILIRUBINUR NEGATIVE  04/14/2016 1527   KETONESUR >80* 04/14/2016 1527   PROTEINUR NEGATIVE 04/14/2016 1527   UROBILINOGEN 1.0 07/09/2014 0126   NITRITE NEGATIVE 04/14/2016 1527   LEUKOCYTESUR NEGATIVE 04/14/2016 1527   Sepsis Labs: !!!!!!!!!!!!!!!!!!!!!!!!!!!!!!!!!!!!!!!!!!!! '@LABRCNTIP' (procalcitonin:4,lacticidven:4) )No results found for this or any previous visit (from the past 240 hour(s)).   Radiological Exams on Admission: Dg Chest 2 View  04/14/2016  CLINICAL DATA:  Shortness of breath, woozy feeling at times, chest discomfort today, hypertension EXAM: CHEST  2 VIEW COMPARISON:  None FINDINGS: Normal heart size, mediastinal contours, and pulmonary vascularity. Lungs clear. No pleural effusion or pneumothorax. Bones unremarkable. IMPRESSION: Normal exam. Electronically Signed   By: Lavonia Dana M.D.   On: 04/14/2016 14:40    EKG: Independently reviewed. Sinus tachycardia.   Assessment/Plan Active Problems:   DKA (diabetic ketoacidoses) (HCC)   HTN (hypertension), benign   Hyponatremia  1-DKA, DM; Ph 7.2 , gap 21, CBG 429 Prior history pre -diabetes.  Admit to step down unit.  IV fluids, insulin Gtt.  B-met every 4 hours.  Transition to long acting Lantus when gap close and bicarb more than 20   2-HTN; hold HCTZ due to dehydration.  PRN hydralazine.   3-Tachycardia; In setting of dehydration. IV fluids.  4-Hyponatremia; multifactorial pseudohyponatremia and secondary to hypovolemia.  IV fluids.  5-Anxiety; she has not been taking xanax of gabapentin. Wont started,.   DVT prophylaxis: Lovenox.  Code Status: Full code.  Family Communication: Care discussed with patient  Disposition Plan: Home in 2 days.  Consults called: none Admission status: step down , inpatient    Niel Hummer A MD [facility] Pager [phone]  If 7PM-7AM, please contact night-coverage [email or web address] Password [email or web address]  04/14/2016, 5:09 PM

## 2016-04-14 NOTE — ED Notes (Signed)
Pt sent from PCP for ongoing SOB, hyperglycemia, and fatigue for a week. CBG 443 on arrival.

## 2016-04-14 NOTE — ED Provider Notes (Signed)
CSN: [id number]     Arrival date & time 04/14/16  1349 History   First MD Initiated Contact with Patient 04/14/16 1406     Chief Complaint  Patient presents with  . Hyperglycemia  . Shortness of Breath    Patient is a 40 y.o. female presenting with hyperglycemia and shortness of breath. The history is provided by the patient.  Hyperglycemia Severity:  Moderate Onset quality:  Gradual Duration:  1 week Timing:  Constant Progression:  Worsening Chronicity:  New Diabetes status:  Controlled with diet Relieved by:  Nothing Associated symptoms: dehydration, fatigue, increased thirst, nausea, polyuria, shortness of breath and weakness   Associated symptoms: no abdominal pain, no chest pain, no dysuria and no vomiting   Shortness of Breath Associated symptoms: cough   Associated symptoms: no abdominal pain, no chest pain and no vomiting    Patient reports for over a week she has had fatigue, polyuria, increased thirst and feeling SOB No CP No fever No vomiting reported She reports she is diabetic but not currently on meds as her PCP is "following the A1C" She was sent by PCP For concern for DKA She reports minimal cough  Past Medical History  Diagnosis Date  . Hypertension    Past Surgical History  Procedure Laterality Date  . Carpal tunnel release    . Oophorectomy Right    No family history on file. Social History  Substance Use Topics  . Smoking status: Never Smoker   . Smokeless tobacco: Never Used  . Alcohol Use: No   OB History    No data available     Review of Systems  Constitutional: Positive for fatigue.  Respiratory: Positive for cough and shortness of breath.   Cardiovascular: Negative for chest pain.  Gastrointestinal: Positive for nausea. Negative for vomiting and abdominal pain.  Endocrine: Positive for polydipsia and polyuria.  Genitourinary: Negative for dysuria.  Neurological: Positive for weakness.  All other systems reviewed and are  negative.     Allergies  Lactose intolerance (gi) and Sulfa antibiotics  Home Medications   Prior to Admission medications   Medication Sig Start Date End Date Taking? Authorizing Provider  acetaminophen (TYLENOL) 500 MG tablet Take 500-1,000 mg by mouth every 6 (six) hours as needed for moderate pain or headache.   Yes Historical Provider, MD  ALPRAZolam Prudy Feeler(XANAX) 0.25 MG tablet Take 0.25 mg by mouth daily as needed for anxiety.   Yes Historical Provider, MD  Cholecalciferol (VITAMIN D PO) Take 1 capsule by mouth daily.   Yes Historical Provider, MD  cyclobenzaprine (FLEXERIL) 10 MG tablet Take 10 mg by mouth 2 (two) times daily as needed for muscle spasms (neck pain/tension headaches).   Yes Historical Provider, MD  gabapentin (NEURONTIN) 100 MG capsule Take 100 mg by mouth 3 (three) times daily.   Yes Historical Provider, MD  GARLIC PO Take 1 capsule by mouth daily.   Yes Historical Provider, MD  hydrochlorothiazide (HYDRODIURIL) 25 MG tablet Take 25 mg by mouth daily.   Yes Historical Provider, MD  ibuprofen (ADVIL,MOTRIN) 200 MG tablet Take 400 mg by mouth every 6 (six) hours as needed for moderate pain.   Yes Historical Provider, MD  Lactase (LACTAID PO) Take 1 tablet by mouth daily as needed (lactos allergy).   Yes Historical Provider, MD  Multiple Vitamin (MULTIVITAMIN WITH MINERALS) TABS tablet Take 1 tablet by mouth daily.   Yes Historical Provider, MD  Simethicone (GAS-X PO) Take 1 tablet by mouth daily as needed (  gas).   Yes Historical Provider, MD   BP 132/101 mmHg  Pulse 143  Temp(Src) 97.7 F (36.5 C) (Oral)  Resp 22  Ht  (1.676 m)  Wt 95.709 kg  BMI 34.07 kg/m2  SpO2 97%  LMP 03/22/2016 Physical Exam CONSTITUTIONAL: Well developed/well nourished, mild distress, smells ketotic HEAD: Normocephalic/atraumatic EYES: EOMI/PERRL ENMT: Mucous membranes dry NECK: supple no meningeal signs SPINE/BACK:entire spine nontender CV: S1/S2 noted, no murmurs/rubs/gallops  noted LUNGS: Lungs are clear to auscultation bilaterally, tachypneic ABDOMEN: soft, nontender, no rebound or guarding, bowel sounds noted throughout abdomen GU:no cva tenderness NEURO: Pt is awake/alert/appropriate, moves all extremitiesx4.  No facial droop.   EXTREMITIES: pulses normal/equal, full ROM, no LE edema SKIN: warm, color normal PSYCH: no abnormalities of mood noted, alert and oriented to situation  ED Course  Procedures  CRITICAL CARE Performed by: Joya Gaskins Total critical care time: 33 minutes Critical care time was exclusive of separately billable procedures and treating other patients. Critical care was necessary to treat or prevent imminent or life-threatening deterioration. Critical care was time spent personally by me on the following activities: development of treatment plan with patient and/or surrogate as well as nursing, discussions with consultants, evaluation of patient's response to treatment, examination of patient, obtaining history from patient or surrogate, ordering and performing treatments and interventions, ordering and review of laboratory studies, ordering and review of radiographic studies, pulse oximetry and re-evaluation of patient's condition. PATIENT WITH DKA BICARB = 12 PT REQUIRES IV FLUIDS AND INSULIN DRIP  Labs Review Labs Reviewed  BASIC METABOLIC PANEL - Abnormal; Notable for the following:    Sodium 126 (*)    Chloride 93 (*)    CO2 12 (*)    Glucose, Bld 429 (*)    Creatinine, Ser 1.08 (*)    Anion gap 21 (*)    All other components within normal limits  CBC - Abnormal; Notable for the following:    RBC 5.89 (*)    Hemoglobin 15.5 (*)    MCV 72.5 (*)    MCHC 36.3 (*)    Platelets 484 (*)    All other components within normal limits  CBG MONITORING, ED - Abnormal; Notable for the following:    Glucose-Capillary 443 (*)    All other components within normal limits  URINALYSIS, ROUTINE W REFLEX MICROSCOPIC (NOT AT [facility])   BLOOD GAS, VENOUS  I-STAT BETA HCG BLOOD, ED ([facility], [facility], [facility] ONLY)    Imaging Review Dg Chest 2 View  04/14/2016  CLINICAL DATA:  Shortness of breath, woozy feeling at times, chest discomfort today, hypertension EXAM: CHEST  2 VIEW COMPARISON:  None FINDINGS: Normal heart size, mediastinal contours, and pulmonary vascularity. Lungs clear. No pleural effusion or pneumothorax. Bones unremarkable. IMPRESSION: Normal exam. Electronically Signed   By: Ulyses Southward M.D.   On: 04/14/2016 14:40   I have personally reviewed and evaluated these images and lab results as part of my medical decision-making.   EKG Interpretation   Date/Time:  Friday April 14 2016 13:59:48 EDT Ventricular Rate:  144 PR Interval:    QRS Duration: 105 QT Interval:  302 QTC Calculation: 468 R Axis:   29 Text Interpretation:  Sinus tachycardia Paired ventricular premature  complexes Low voltage, precordial leads Anteroseptal infarct, old Baseline  wander in lead(s) II III aVF Abnormal ekg No previous ECGs available  Confirmed by Bebe Shaggy  MD, Dorinda Hill ([id number]) on 04/14/2016 2:16:21 PM Also  confirmed by Bebe Shaggy  MD, Luella Gardenhire ([id number]), editor Wandalee Ferdinand (  [id number])   on 04/14/2016 2:30:29 PM     Medications  insulin regular (NOVOLIN R,HUMULIN R) 250 Units in sodium chloride 0.9 % 250 mL (1 Units/mL) infusion (not administered)  sodium chloride 0.9 % bolus 1,000 mL (not administered)  sodium chloride 0.9 % bolus 1,000 mL (0 mLs Intravenous Stopped 04/14/16 1516)   PT WITH DKA SHE IS TACHYPNEA, LIKELY ACIDOSIS CXR NEGATIVE PT IMPROVING WITH IV FLUIDS INSULIN DRIP ORDERED D/W DR REGALADO FOR ADMISSION TO STEPDOWN UNIT HR AT THIS TIME 110-115  MDM   Final diagnoses:  Diabetic ketoacidosis without coma associated with other specified diabetes mellitus (HCC)    Nursing notes including past medical history and social history reviewed and considered in documentation xrays/imaging reviewed by myself and considered during  evaluation Labs/vital reviewed myself and considered during evaluation     Zadie Rhineonald Domonique Cothran, MD 04/14/16 1545

## 2016-04-14 NOTE — ED Notes (Signed)
MD at bedside. 

## 2016-04-14 NOTE — ED Notes (Signed)
Patient transported to X-ray 

## 2016-04-15 LAB — CBC
HCT: 32.2 % — ABNORMAL LOW (ref 36.0–46.0)
HEMOGLOBIN: 11.8 g/dL — AB (ref 12.0–15.0)
MCH: 25.8 pg — AB (ref 26.0–34.0)
MCHC: 36.6 g/dL — ABNORMAL HIGH (ref 30.0–36.0)
MCV: 70.5 fL — ABNORMAL LOW (ref 78.0–100.0)
PLATELETS: 305 10*3/uL (ref 150–400)
RBC: 4.57 MIL/uL (ref 3.87–5.11)
RDW: 14.4 % (ref 11.5–15.5)
WBC: 6.1 10*3/uL (ref 4.0–10.5)

## 2016-04-15 LAB — GLUCOSE, CAPILLARY
GLUCOSE-CAPILLARY: 173 mg/dL — AB (ref 65–99)
GLUCOSE-CAPILLARY: 173 mg/dL — AB (ref 65–99)
GLUCOSE-CAPILLARY: 194 mg/dL — AB (ref 65–99)
GLUCOSE-CAPILLARY: 202 mg/dL — AB (ref 65–99)
GLUCOSE-CAPILLARY: 228 mg/dL — AB (ref 65–99)
GLUCOSE-CAPILLARY: 243 mg/dL — AB (ref 65–99)
Glucose-Capillary: 124 mg/dL — ABNORMAL HIGH (ref 65–99)
Glucose-Capillary: 137 mg/dL — ABNORMAL HIGH (ref 65–99)
Glucose-Capillary: 148 mg/dL — ABNORMAL HIGH (ref 65–99)
Glucose-Capillary: 160 mg/dL — ABNORMAL HIGH (ref 65–99)
Glucose-Capillary: 165 mg/dL — ABNORMAL HIGH (ref 65–99)
Glucose-Capillary: 173 mg/dL — ABNORMAL HIGH (ref 65–99)
Glucose-Capillary: 174 mg/dL — ABNORMAL HIGH (ref 65–99)
Glucose-Capillary: 177 mg/dL — ABNORMAL HIGH (ref 65–99)
Glucose-Capillary: 188 mg/dL — ABNORMAL HIGH (ref 65–99)
Glucose-Capillary: 199 mg/dL — ABNORMAL HIGH (ref 65–99)
Glucose-Capillary: 208 mg/dL — ABNORMAL HIGH (ref 65–99)
Glucose-Capillary: 222 mg/dL — ABNORMAL HIGH (ref 65–99)
Glucose-Capillary: 226 mg/dL — ABNORMAL HIGH (ref 65–99)
Glucose-Capillary: 228 mg/dL — ABNORMAL HIGH (ref 65–99)
Glucose-Capillary: 265 mg/dL — ABNORMAL HIGH (ref 65–99)
Glucose-Capillary: 306 mg/dL — ABNORMAL HIGH (ref 65–99)

## 2016-04-15 LAB — BASIC METABOLIC PANEL
Anion gap: 5 (ref 5–15)
Anion gap: 4 — ABNORMAL LOW (ref 5–15)
Anion gap: 8 (ref 5–15)
Anion gap: 5 (ref 5–15)
Anion gap: 6 (ref 5–15)
BUN: 7 mg/dL (ref 6–20)
BUN: 7 mg/dL (ref 6–20)
BUN: 7 mg/dL (ref 6–20)
BUN: 8 mg/dL (ref 6–20)
BUN: 10 mg/dL (ref 6–20)
CALCIUM: 8.1 mg/dL — AB (ref 8.9–10.3)
CALCIUM: 8.1 mg/dL — AB (ref 8.9–10.3)
CALCIUM: 8.2 mg/dL — AB (ref 8.9–10.3)
CHLORIDE: 109 mmol/L (ref 101–111)
CHLORIDE: 109 mmol/L (ref 101–111)
CO2: 17 mmol/L — ABNORMAL LOW (ref 22–32)
CO2: 19 mmol/L — ABNORMAL LOW (ref 22–32)
CO2: 18 mmol/L — ABNORMAL LOW (ref 22–32)
CO2: 19 mmol/L — ABNORMAL LOW (ref 22–32)
CO2: 18 mmol/L — AB (ref 22–32)
CREATININE: 0.52 mg/dL (ref 0.44–1.00)
CREATININE: 0.43 mg/dL — AB (ref 0.44–1.00)
CREATININE: 0.46 mg/dL (ref 0.44–1.00)
CREATININE: 0.53 mg/dL (ref 0.44–1.00)
CREATININE: 0.52 mg/dL (ref 0.44–1.00)
Calcium: 8.1 mg/dL — ABNORMAL LOW (ref 8.9–10.3)
Calcium: 7.9 mg/dL — ABNORMAL LOW (ref 8.9–10.3)
Chloride: 108 mmol/L (ref 101–111)
Chloride: 104 mmol/L (ref 101–111)
Chloride: 109 mmol/L (ref 101–111)
GFR calc Af Amer: >60 mL/min (ref >60)
GFR calc non Af Amer: >60 mL/min (ref >60)
GFR calc non Af Amer: >60 mL/min (ref >60)
Glucose, Bld: 137 mg/dL — ABNORMAL HIGH (ref 65–99)
Glucose, Bld: 170 mg/dL — ABNORMAL HIGH (ref 65–99)
Glucose, Bld: 167 mg/dL — ABNORMAL HIGH (ref 65–99)
Glucose, Bld: 156 mg/dL — ABNORMAL HIGH (ref 65–99)
Glucose, Bld: 227 mg/dL — ABNORMAL HIGH (ref 65–99)
Potassium: 3.5 mmol/L (ref 3.5–5.1)
Potassium: 3.2 mmol/L — ABNORMAL LOW (ref 3.5–5.1)
Potassium: 3.3 mmol/L — ABNORMAL LOW (ref 3.5–5.1)
Potassium: 3.2 mmol/L — ABNORMAL LOW (ref 3.5–5.1)
Potassium: 4 mmol/L (ref 3.5–5.1)
SODIUM: 131 mmol/L — AB (ref 135–145)
SODIUM: 131 mmol/L — AB (ref 135–145)
SODIUM: 130 mmol/L — AB (ref 135–145)
SODIUM: 133 mmol/L — AB (ref 135–145)
SODIUM: 133 mmol/L — AB (ref 135–145)

## 2016-04-15 MED ORDER — POTASSIUM CHLORIDE CRYS ER 20 MEQ PO TBCR
40.0000 meq | EXTENDED_RELEASE_TABLET | Freq: Once | ORAL | Status: AC
  Administered 2016-04-15: 40 meq via ORAL
  Filled 2016-04-15: qty 2

## 2016-04-15 MED ORDER — INSULIN ASPART 100 UNIT/ML ~~LOC~~ SOLN
0.0000 [IU] | Freq: Three times a day (TID) | SUBCUTANEOUS | Status: DC
  Administered 2016-04-15: 1 [IU] via SUBCUTANEOUS
  Administered 2016-04-16 (×3): 3 [IU] via SUBCUTANEOUS
  Administered 2016-04-17: 2 [IU] via SUBCUTANEOUS
  Administered 2016-04-17 (×2): 3 [IU] via SUBCUTANEOUS
  Administered 2016-04-18: 2 [IU] via SUBCUTANEOUS
  Administered 2016-04-18: 3 [IU] via SUBCUTANEOUS

## 2016-04-15 MED ORDER — SODIUM CHLORIDE 0.9 % IV BOLUS (SEPSIS)
500.0000 mL | Freq: Once | INTRAVENOUS | Status: AC
  Administered 2016-04-15: 500 mL via INTRAVENOUS

## 2016-04-15 MED ORDER — POTASSIUM CHLORIDE 10 MEQ/100ML IV SOLN
10.0000 meq | INTRAVENOUS | Status: AC
  Administered 2016-04-15 (×2): 10 meq via INTRAVENOUS
  Filled 2016-04-15 (×2): qty 100

## 2016-04-15 MED ORDER — SODIUM CHLORIDE 0.9 % IV BOLUS (SEPSIS)
1000.0000 mL | Freq: Once | INTRAVENOUS | Status: AC
  Administered 2016-04-15: 1000 mL via INTRAVENOUS

## 2016-04-15 MED ORDER — POTASSIUM CHLORIDE 10 MEQ/100ML IV SOLN
10.0000 meq | INTRAVENOUS | Status: AC
  Administered 2016-04-15 (×3): 10 meq via INTRAVENOUS
  Filled 2016-04-15 (×3): qty 100

## 2016-04-15 MED ORDER — INSULIN GLARGINE 100 UNIT/ML ~~LOC~~ SOLN
18.0000 [IU] | Freq: Every day | SUBCUTANEOUS | Status: DC
  Administered 2016-04-15: 18 [IU] via SUBCUTANEOUS
  Filled 2016-04-15 (×2): qty 0.18

## 2016-04-15 NOTE — Progress Notes (Signed)
PROGRESS NOTE    Amy Klein  QQI:[id number] DOB: 06-03-1976 DOA: 04/14/2016 PCP: Lujean Amel, MD   Brief Narrative: Amy Klein is a 40 y.o. female with medical history significant of HTN, pre diabetic who was refer to ED by PCP for evaluation of hyperglycemia. Patient went to see her PCP today because she has not been feeling well for last 2 weeks. She report fatigue, dyspnea on exertion, polyuria, polydipsia. She was weight today at her PCP office and she has lost 20 pounds since may.  She denies abdominal pain,nausea,vomiting, chest pain.   ED Course: Presents hyperglycemia, DKA, PH 7.2, CBG 429, bicarb 12, sodium 126. HR 144.    Assessment & Plan:   Active Problems:   DKA (diabetic ketoacidoses) (HCC)   HTN (hypertension), benign   Hyponatremia  1-DKA, DM; Ph 7.2 , gap 21, CBG 429 Prior history pre -diabetes.  Continue with IV fluids, insulin Gtt.  B-met every 4 hours.  Transition to long acting Lantus when gap close and bicarb more than 20  IV bolus now, gap close, but bicarb at  18. Follow next B-met.  Hb A1c at 14   2-HTN; hold HCTZ due to dehydration.  PRN hydralazine.   3-Tachycardia; In setting of dehydration. IV fluids. Improved.   4-Hyponatremia; multifactorial pseudohyponatremia and secondary to hypovolemia.  IV fluids.  5-Anxiety; she has not been taking xanax of gabapentin. Wont started,.     DVT prophylaxis: lovenox Code Status: full code.  Family Communication; sister at bedside.  Disposition Plan: home in 1 or 2 days    Consultants:   none  Procedures:  None  Antimicrobials  none    Subjective: Feeling better. No new complaints   Objective: Filed Vitals:   04/15/16 0400 04/15/16 0600 04/15/16 0800 04/15/16 1000  BP: 116/58 129/74 130/65 123/67  Pulse: 90 87  88  Temp: 98.3 F (36.8 C)  98.3 F (36.8 C)   TempSrc: Oral  Oral   Resp: _0 Height:      Weight:      SpO2: 100% 100% 100% 100%     Intake/Output Summary (Last 24 hours) at 04/15/16 1102 Last data filed at 04/15/16 1007  Gross per 24 hour  Intake 2648.96 ml  Output      0 ml  Net 2648.96 ml   Filed Weights   04/14/16 1359  Weight: 95.709 kg (211 lb)    Examination:  General exam: Appears calm and comfortable  Respiratory system: Clear to auscultation. Respiratory effort normal. Cardiovascular system: S1 & S2 heard, RRR. No JVD, murmurs, rubs, gallops or clicks. No pedal edema. Gastrointestinal system: Abdomen is nondistended, soft and nontender. No organomegaly or masses felt. Normal bowel sounds heard. Central nervous system: Alert and oriented. No focal neurological deficits. Extremities: Symmetric 5 x 5 power. Skin: No rashes, lesions or ulcers Psychiatry: Judgement and insight appear normal. Mood & affect appropriate.     Data Reviewed: I have personally reviewed following labs and imaging studies  CBC:  Recent Labs Lab 04/14/16 1428 04/15/16 0523  WBC 9.9 6.1  HGB 15.5* 11.8*  HCT 42.7 32.2*  MCV 72.5* 70.5*  PLT 484* 941   Basic Metabolic Panel:  Recent Labs Lab 04/14/16 1739 04/14/16 2104 04/15/16 0133 04/15/16 0523 04/15/16 0923  NA 134* 134* 131* 131* 130*  K 3.4* 3.4* 3.5 3.2* 3.3*  CL 108 109 109 108 104  CO2 12* 16* 17* 19* 18*  GLUCOSE 260* 192* 137* 170* 167*  BUN  _0 CREATININE 0.83 0.61 0.52 0.43* 0.46  CALCIUM 8.0* 8.1* 8.1* 8.1* 8.2*   GFR: Estimated Creatinine Clearance: 108 mL/min (by C-G formula based on Cr of 0.46). Liver Function Tests: No results for input(s): AST, ALT, ALKPHOS, BILITOT, PROT, ALBUMIN in the last 168 hours. No results for input(s): LIPASE, AMYLASE in the last 168 hours. No results for input(s): AMMONIA in the last 168 hours. Coagulation Profile: No results for input(s): INR, PROTIME in the last 168 hours. Cardiac Enzymes: No results for input(s): CKTOTAL, CKMB, CKMBINDEX, TROPONINI in the last 168 hours. BNP (last 3  results) No results for input(s): PROBNP in the last 8760 hours. HbA1C: No results for input(s): HGBA1C in the last 72 hours. CBG:  Recent Labs Lab 04/15/16 0412 04/15/16 0514 04/15/16 0619 04/15/16 0730 04/15/16 0853  GLUCAP 194* 174* 165* 124* 160*   Lipid Profile: No results for input(s): CHOL, HDL, LDLCALC, TRIG, CHOLHDL, LDLDIRECT in the last 72 hours. Thyroid Function Tests: No results for input(s): TSH, T4TOTAL, FREET4, T3FREE, THYROIDAB in the last 72 hours. Anemia Panel: No results for input(s): VITAMINB12, FOLATE, FERRITIN, TIBC, IRON, RETICCTPCT in the last 72 hours. Sepsis Labs: No results for input(s): PROCALCITON, LATICACIDVEN in the last 168 hours.  Recent Results (from the past 240 hour(s))  MRSA PCR Screening     Status: None   Collection Time: 04/14/16  5:30 PM  Result Value Ref Range Status   MRSA by PCR NEGATIVE NEGATIVE Final    Comment:        The GeneXpert MRSA Assay (FDA approved for NASAL specimens only), is one component of a comprehensive MRSA colonization surveillance program. It is not intended to diagnose MRSA infection nor to guide or monitor treatment for MRSA infections.          Radiology Studies: Dg Chest 2 View  04/14/2016  CLINICAL DATA:  Shortness of breath, woozy feeling at times, chest discomfort today, hypertension EXAM: CHEST  2 VIEW COMPARISON:  None FINDINGS: Normal heart size, mediastinal contours, and pulmonary vascularity. Lungs clear. No pleural effusion or pneumothorax. Bones unremarkable. IMPRESSION: Normal exam. Electronically Signed   By: Lavonia Dana M.D.   On: 04/14/2016 14:40        Scheduled Meds: . docusate sodium  100 mg Oral BID  . enoxaparin (LOVENOX) injection  40 mg Subcutaneous Q24H  . potassium chloride  10 mEq Intravenous Q1 Hr x 3  . potassium chloride  40 mEq Oral Once  . sodium chloride  1,000 mL Intravenous Once  . sodium chloride flush  3 mL Intravenous Q12H   Continuous Infusions: .  sodium chloride Stopped (04/14/16 1937)  . dextrose 5 % and 0.45% NaCl 100 mL/hr at 04/15/16 0413  . insulin (NOVOLIN-R) infusion 7 Units/hr (04/15/16 1003)     LOS: 1 day    Time spent: 35 minutes.     Elmarie Shiley, MD [facility] Pager [phone]  If 7PM-7AM, please contact night-coverage [email or web address] Password [email or web address] 04/15/2016, 11:02 AM

## 2016-04-16 LAB — BASIC METABOLIC PANEL
Anion gap: 6 (ref 5–15)
BUN: 7 mg/dL (ref 6–20)
CHLORIDE: 110 mmol/L (ref 101–111)
CO2: 18 mmol/L — AB (ref 22–32)
CREATININE: 0.49 mg/dL (ref 0.44–1.00)
Calcium: 7.9 mg/dL — ABNORMAL LOW (ref 8.9–10.3)
GFR calc Af Amer: >60 mL/min (ref >60)
GFR calc non Af Amer: >60 mL/min (ref >60)
Glucose, Bld: 212 mg/dL — ABNORMAL HIGH (ref 65–99)
Potassium: 3.7 mmol/L (ref 3.5–5.1)
Sodium: 134 mmol/L — ABNORMAL LOW (ref 135–145)

## 2016-04-16 LAB — CBC
HEMATOCRIT: 29.9 % — AB (ref 36.0–46.0)
Hemoglobin: 10.6 g/dL — ABNORMAL LOW (ref 12.0–15.0)
MCH: 26 pg (ref 26.0–34.0)
MCHC: 35.5 g/dL (ref 30.0–36.0)
MCV: 73.3 fL — AB (ref 78.0–100.0)
PLATELETS: 268 10*3/uL (ref 150–400)
RBC: 4.08 MIL/uL (ref 3.87–5.11)
RDW: 14.7 % (ref 11.5–15.5)
WBC: 4.8 10*3/uL (ref 4.0–10.5)

## 2016-04-16 LAB — GLUCOSE, CAPILLARY
GLUCOSE-CAPILLARY: 250 mg/dL — AB (ref 65–99)
Glucose-Capillary: 210 mg/dL — ABNORMAL HIGH (ref 65–99)
Glucose-Capillary: 227 mg/dL — ABNORMAL HIGH (ref 65–99)
Glucose-Capillary: 254 mg/dL — ABNORMAL HIGH (ref 65–99)

## 2016-04-16 MED ORDER — SODIUM CHLORIDE 0.9 % IV SOLN
INTRAVENOUS | Status: DC
  Administered 2016-04-16 – 2016-04-18 (×2): via INTRAVENOUS

## 2016-04-16 MED ORDER — INSULIN GLARGINE 100 UNIT/ML ~~LOC~~ SOLN
20.0000 [IU] | Freq: Every day | SUBCUTANEOUS | Status: DC
  Administered 2016-04-16 – 2016-04-17 (×2): 20 [IU] via SUBCUTANEOUS
  Filled 2016-04-16 (×2): qty 0.2

## 2016-04-16 MED ORDER — PREMIER PROTEIN SHAKE
11.0000 [oz_av] | Freq: Two times a day (BID) | ORAL | Status: DC
  Administered 2016-04-17 – 2016-04-18 (×2): 11 [oz_av] via ORAL
  Filled 2016-04-16: qty 325.31

## 2016-04-16 MED ORDER — LIVING WELL WITH DIABETES BOOK
Freq: Once | Status: AC
  Administered 2016-04-16: 15:00:00
  Filled 2016-04-16: qty 1

## 2016-04-16 MED ORDER — SODIUM BICARBONATE 650 MG PO TABS
650.0000 mg | ORAL_TABLET | Freq: Two times a day (BID) | ORAL | Status: DC
  Administered 2016-04-16 – 2016-04-18 (×5): 650 mg via ORAL
  Filled 2016-04-16 (×5): qty 1

## 2016-04-16 NOTE — Progress Notes (Signed)
Inpatient Diabetes Program Recommendations  AACE/ADA: New Consensus Statement on Inpatient Glycemic Control (2015)  Target Ranges:  Prepandial:   less than 140 mg/dL      Peak postprandial:   less than 180 mg/dL (1-2 hours)      Critically ill patients:  140 - 180 mg/dL   Lab Results  Component Value Date   GLUCAP 227* 04/16/2016   Results for Amy Klein, Amy Klein (MRN [id number]) as of 04/16/2016 13:02  Ref. Range 04/15/2016 01:33 04/15/2016 05:23 04/15/2016 09:23 04/15/2016 13:51 04/15/2016 19:34 04/16/2016 07:39  Glucose Latest Ref Range: 65-99 mg/dL 782137 (H) 956170 (H) 213167 (H) 156 (H) 227 (H) 212 (H)   Review of Glycemic Control  Diabetes history: Pre-diabetes Outpatient Diabetes medications: None Current orders for Inpatient glycemic control: Lantus 20 units QD, Novolog sensitive tidwc  Transitioned off IV insulin to SQ insulin.  Inpatient Diabetes Program Recommendations:   Needs HgbA1C to assess glycemic control prior to admission Ordered referral for diabetes education to [facility]. Ordered Living Well with Diabetes book.  Will f/u with pt in am.  Thank you. Ailene Ardshonda Sander Remedios, RD, LDN, CDE Inpatient Diabetes Coordinator [phone]

## 2016-04-16 NOTE — Progress Notes (Signed)
Initial Nutrition Assessment  DOCUMENTATION CODES:   Obesity unspecified  INTERVENTION:   Provide Premier Protein supplement BID, each provides 160 kcal and 30g protein RD to follow-up to provide diet education per patient request  NUTRITION DIAGNOSIS:   Food and nutrition related knowledge deficit related to limited prior education as evidenced by per patient/family report.  GOAL:   Patient will meet greater than or equal to 90% of their needs  MONITOR:   PO intake, Supplement acceptance, Labs, Weight trends, I & O's  REASON FOR ASSESSMENT:   Malnutrition Screening Tool    ASSESSMENT:   40 y.o. female with medical history significant of HTN, pre diabetic who was refer to ED by PCP for evaluation of hyperglycemia. Patient went to see her PCP today because she has not been feeling well for last 2 weeks. She report fatigue, dyspnea on exertion, polyuria, polydipsia. She was weight today at her PCP office and she has lost 20 pounds since may.   Pt in room with visitors at bedside. Pt reports eating well with no issues PTA except for some nausea. She is eating 100% of her meals at this time. Pt continues to have elevated CBGs. Per pt UBW is 230 lb and she has lost 20 lb since May (8% wt loss x 2 months, significant for time frame). Pt would benefit from nutritional supplements, she is willing to try Premier Protein shakes. Will order.  Nutrition focused physical exam shows no sign of depletion of muscle mass or body fat.  Patient requesting diet education prior to discharge, may discharge tomorrow. RD to follow-up 7/10 for education per patient request.  Medications reviewed. Labs reviewed: CBGs: 210-227 Low Na  Diet Order:  Diet Carb Modified Fluid consistency:: Thin; Room service appropriate?: Yes  Skin:  Reviewed, no issues  Last BM:  7/6  Height:   Ht Readings from Last 1 Encounters:  04/14/16 5\' 6"  (1.676 m)    Weight:   Wt Readings from Last 1 Encounters:   04/14/16 211 lb (95.709 kg)    Ideal Body Weight:  59.1 kg  BMI:  Body mass index is 34.07 kg/(m^2).  Estimated Nutritional Needs:   Kcal:  1700-1900  Protein:  70-80g  Fluid:  1.9L/day  EDUCATION NEEDS:   Education needs addressed  Tilda FrancoLindsey Everard Interrante, MS, RD, LDN Pager: [phone] After Hours Pager: [phone]

## 2016-04-16 NOTE — Progress Notes (Signed)
PROGRESS NOTE    Amy Klein  RXY:[id number] DOB: 05-14-1976 DOA: 04/14/2016 PCP: Lujean Amel, MD   Brief Narrative: Amy Klein is a 40 y.o. female with medical history significant of HTN, pre diabetic who was refer to ED by PCP for evaluation of hyperglycemia. Patient went to see her PCP today because she has not been feeling well for last 2 weeks. She report fatigue, dyspnea on exertion, polyuria, polydipsia. She was weight today at her PCP office and she has lost 20 pounds since may.  She denies abdominal pain,nausea,vomiting, chest pain.   ED Course: Presents hyperglycemia, DKA, PH 7.2, CBG 429, bicarb 12, sodium 126. HR 144.    Assessment & Plan:   Active Problems:   DKA (diabetic ketoacidoses) (HCC)   HTN (hypertension), benign   Hyponatremia  1-DKA, DM; Ph 7.2 , gap 21, CBG 429 Prior history pre -diabetes.  Received  IV fluids, insulin Gtt.  gap close, but bicarb at  18. She was transition to lantus.  Hb A1c at 14  Will increase lantus to 20 units.  Continue with SSI.   2-HTN; hold HCTZ due to dehydration.  PRN hydralazine.   3-Tachycardia; In setting of dehydration. IV fluids. Resolved   4-Hyponatremia; multifactorial pseudohyponatremia and secondary to hypovolemia.  Improving.  5-Anxiety; she has not been taking xanax of gabapentin. Wont started,.     DVT prophylaxis: lovenox Code Status: full code.  Family Communication; sister at bedside.  Disposition Plan: home in 1 or 2 days , transfer to telemetry if b-met stable.     Consultants:   none  Procedures:  None  Antimicrobials  none    Subjective: Feeling better. No new complaints  Relates feeling less tired and fatigue.  Report less dyspnea. Breathing better   Objective: Filed Vitals:   04/16/16 0400 04/16/16 0500 04/16/16 0600 04/16/16 0805  BP:      Pulse: 104 94 86   Temp:    98 F (36.7 C)  TempSrc:    Oral  Resp: _0 Height:      Weight:      SpO2: 100% 100%  100%     Intake/Output Summary (Last 24 hours) at 04/16/16 0826 Last data filed at 04/16/16 0600  Gross per 24 hour  Intake 3384.19 ml  Output      0 ml  Net 3384.19 ml   Filed Weights   04/14/16 1359  Weight: 95.709 kg (211 lb)    Examination:  General exam: Appears calm and comfortable  Respiratory system: Clear to auscultation. Respiratory effort normal. Cardiovascular system: S1 & S2 heard, RRR. No JVD, murmurs, rubs, gallops or clicks. No pedal edema. Gastrointestinal system: Abdomen is nondistended, soft and nontender. No organomegaly or masses felt. Normal bowel sounds heard. Central nervous system: Alert and oriented. No focal neurological deficits. Extremities: Symmetric 5 x 5 power. Skin: No rashes, lesions or ulcers Psychiatry: Judgement and insight appear normal. Mood & affect appropriate.     Data Reviewed: I have personally reviewed following labs and imaging studies  CBC:  Recent Labs Lab 04/14/16 1428 04/15/16 0523 04/16/16 0739  WBC 9.9 6.1 4.8  HGB 15.5* 11.8* 10.6*  HCT 42.7 32.2* 29.9*  MCV 72.5* 70.5* 73.3*  PLT 484* 305 628   Basic Metabolic Panel:  Recent Labs Lab 04/15/16 0133 04/15/16 0523 04/15/16 0923 04/15/16 1351 04/15/16 1934  NA 131* 131* 130* 133* 133*  K 3.5 3.2* 3.3* 3.2* 4.0  CL 109 108 104 109 109  CO2 17* 19* 18* 19* 18*  GLUCOSE 137* 170* 167* 156* 227*  BUN _0 CREATININE 0.52 0.43* 0.46 0.53 0.52  CALCIUM 8.1* 8.1* 8.2* 8.1* 7.9*   GFR: Estimated Creatinine Clearance: 108 mL/min (by C-G formula based on Cr of 0.52). Liver Function Tests: No results for input(s): AST, ALT, ALKPHOS, BILITOT, PROT, ALBUMIN in the last 168 hours. No results for input(s): LIPASE, AMYLASE in the last 168 hours. No results for input(s): AMMONIA in the last 168 hours. Coagulation Profile: No results for input(s): INR, PROTIME in the last 168 hours. Cardiac Enzymes: No results for input(s): CKTOTAL, CKMB, CKMBINDEX, TROPONINI  in the last 168 hours. BNP (last 3 results) No results for input(s): PROBNP in the last 8760 hours. HbA1C: No results for input(s): HGBA1C in the last 72 hours. CBG:  Recent Labs Lab 04/15/16 1309 04/15/16 1506 04/15/16 1602 04/15/16 2120 04/16/16 0746  GLUCAP 173* 208* 148* 265* 210*   Lipid Profile: No results for input(s): CHOL, HDL, LDLCALC, TRIG, CHOLHDL, LDLDIRECT in the last 72 hours. Thyroid Function Tests: No results for input(s): TSH, T4TOTAL, FREET4, T3FREE, THYROIDAB in the last 72 hours. Anemia Panel: No results for input(s): VITAMINB12, FOLATE, FERRITIN, TIBC, IRON, RETICCTPCT in the last 72 hours. Sepsis Labs: No results for input(s): PROCALCITON, LATICACIDVEN in the last 168 hours.  Recent Results (from the past 240 hour(s))  MRSA PCR Screening     Status: None   Collection Time: 04/14/16  5:30 PM  Result Value Ref Range Status   MRSA by PCR NEGATIVE NEGATIVE Final    Comment:        The GeneXpert MRSA Assay (FDA approved for NASAL specimens only), is one component of a comprehensive MRSA colonization surveillance program. It is not intended to diagnose MRSA infection nor to guide or monitor treatment for MRSA infections.          Radiology Studies: Dg Chest 2 View  04/14/2016  CLINICAL DATA:  Shortness of breath, woozy feeling at times, chest discomfort today, hypertension EXAM: CHEST  2 VIEW COMPARISON:  None FINDINGS: Normal heart size, mediastinal contours, and pulmonary vascularity. Lungs clear. No pleural effusion or pneumothorax. Bones unremarkable. IMPRESSION: Normal exam. Electronically Signed   By: Lavonia Dana M.D.   On: 04/14/2016 14:40        Scheduled Meds: . docusate sodium  100 mg Oral BID  . enoxaparin (LOVENOX) injection  40 mg Subcutaneous Q24H  . insulin aspart  0-9 Units Subcutaneous TID WC  . insulin glargine  20 Units Subcutaneous Daily  . sodium chloride flush  3 mL Intravenous Q12H   Continuous Infusions: . sodium  chloride 125 mL/hr at 04/16/16 0806     LOS: 2 days    Time spent: 35 minutes.     Elmarie Shiley, MD [facility] Pager [phone]  If 7PM-7AM, please contact night-coverage [email or web address] Password [email or web address] 04/16/2016, 8:26 AM

## 2016-04-17 LAB — IRON AND TIBC
IRON: 69 ug/dL (ref 28–170)
Saturation Ratios: 26 % (ref 10.4–31.8)
TIBC: 269 ug/dL (ref 250–450)
UIBC: 200 ug/dL

## 2016-04-17 LAB — BASIC METABOLIC PANEL
ANION GAP: 5 (ref 5–15)
BUN: 7 mg/dL (ref 6–20)
CHLORIDE: 109 mmol/L (ref 101–111)
CO2: 21 mmol/L — ABNORMAL LOW (ref 22–32)
Calcium: 8.2 mg/dL — ABNORMAL LOW (ref 8.9–10.3)
Creatinine, Ser: 0.48 mg/dL (ref 0.44–1.00)
GFR calc Af Amer: >60 mL/min (ref >60)
GLUCOSE: 229 mg/dL — AB (ref 65–99)
POTASSIUM: 3.4 mmol/L — AB (ref 3.5–5.1)
Sodium: 135 mmol/L (ref 135–145)

## 2016-04-17 LAB — FERRITIN: Ferritin: 63 ng/mL (ref 11–307)

## 2016-04-17 LAB — GLUCOSE, CAPILLARY
GLUCOSE-CAPILLARY: 214 mg/dL — AB (ref 65–99)
GLUCOSE-CAPILLARY: 218 mg/dL — AB (ref 65–99)
Glucose-Capillary: 210 mg/dL — ABNORMAL HIGH (ref 65–99)
Glucose-Capillary: 160 mg/dL — ABNORMAL HIGH (ref 65–99)
Glucose-Capillary: 232 mg/dL — ABNORMAL HIGH (ref 65–99)

## 2016-04-17 LAB — RETICULOCYTES
RBC.: 4.04 MIL/uL (ref 3.87–5.11)
RETIC COUNT ABSOLUTE: 72.7 10*3/uL (ref 19.0–186.0)
Retic Ct Pct: 1.8 % (ref 0.4–3.1)

## 2016-04-17 LAB — FOLATE: FOLATE: 14.3 ng/mL (ref >5.9)

## 2016-04-17 LAB — VITAMIN B12: Vitamin B-12: 349 pg/mL (ref 180–914)

## 2016-04-17 MED ORDER — GABAPENTIN 100 MG PO CAPS
100.0000 mg | ORAL_CAPSULE | Freq: Three times a day (TID) | ORAL | Status: DC
  Administered 2016-04-17 – 2016-04-18 (×3): 100 mg via ORAL
  Filled 2016-04-17 (×3): qty 1

## 2016-04-17 MED ORDER — GABAPENTIN 100 MG PO CAPS
100.0000 mg | ORAL_CAPSULE | Freq: Once | ORAL | Status: AC
  Administered 2016-04-17: 100 mg via ORAL
  Filled 2016-04-17: qty 1

## 2016-04-17 MED ORDER — TRAMADOL HCL 50 MG PO TABS
50.0000 mg | ORAL_TABLET | Freq: Three times a day (TID) | ORAL | Status: DC | PRN
  Administered 2016-04-17: 50 mg via ORAL
  Filled 2016-04-17: qty 1

## 2016-04-17 MED ORDER — INSULIN GLARGINE 100 UNIT/ML ~~LOC~~ SOLN
5.0000 [IU] | Freq: Once | SUBCUTANEOUS | Status: AC
  Administered 2016-04-17: 5 [IU] via SUBCUTANEOUS
  Filled 2016-04-17: qty 0.05

## 2016-04-17 MED ORDER — POTASSIUM CHLORIDE CRYS ER 20 MEQ PO TBCR
20.0000 meq | EXTENDED_RELEASE_TABLET | Freq: Once | ORAL | Status: AC
  Administered 2016-04-17: 20 meq via ORAL
  Filled 2016-04-17: qty 1

## 2016-04-17 MED ORDER — INSULIN GLARGINE 100 UNIT/ML ~~LOC~~ SOLN
25.0000 [IU] | Freq: Every day | SUBCUTANEOUS | Status: DC
  Administered 2016-04-18: 25 [IU] via SUBCUTANEOUS
  Filled 2016-04-17: qty 0.25

## 2016-04-17 MED ORDER — POTASSIUM CHLORIDE CRYS ER 20 MEQ PO TBCR
40.0000 meq | EXTENDED_RELEASE_TABLET | Freq: Once | ORAL | Status: AC
  Administered 2016-04-17: 40 meq via ORAL
  Filled 2016-04-17: qty 2

## 2016-04-17 NOTE — Progress Notes (Signed)
PROGRESS NOTE    Amy Klein  WGN:[id number] DOB: 09-12-1976 DOA: 04/14/2016 PCP: Darrow Bussing, MD   Brief Narrative: Amy Klein is a 40 y.o. female with medical history significant of HTN, pre diabetic who was refer to ED by PCP for evaluation of hyperglycemia. Patient went to see her PCP today because she has not been feeling well for last 2 weeks. She report fatigue, dyspnea on exertion, polyuria, polydipsia. She was weight today at her PCP office and she has lost 20 pounds since may.  She denies abdominal pain,nausea,vomiting, chest pain.   ED Course: Presents hyperglycemia, DKA, PH 7.2, CBG 429, bicarb 12, sodium 126. HR 144.    Assessment & Plan:   Active Problems:   DKA (diabetic ketoacidoses) (HCC)   HTN (hypertension), benign   Hyponatremia  1-DKA, DM; Ph 7.2 , gap 21, CBG 429 Prior history pre -diabetes.  Received  IV fluids, insulin Gtt.  gap close, but bicarb at  18. She was transition to lantus.  Hb A1c at 14 at physician office.  Will increase lantus to 25 units.  Continue with SSI.   2-HTN; hold HCTZ due to dehydration.  PRN hydralazine.   3-Tachycardia; In setting of dehydration. IV fluids. Resolved   4-Hyponatremia; multifactorial pseudohyponatremia and secondary to hypovolemia.  Improving.  5-Anxiety; she has not been taking xanax. Resume gabapentin  Depression. Denies suicidal thoughts, feels overwhelm with diagnosis and work problems. SW for support.  6-hypokalemia; replace   DVT prophylaxis: lovenox Code Status: full code.  Family Communication; sister at bedside.  Disposition Plan: home in 1     Consultants:   none  Procedures:  None  Antimicrobials  none    Subjective: Feeling sad, overwhelm with diagnosis. She was crying.  Denies suicidal thoughts.   Objective: Filed Vitals:   04/16/16 1709 04/16/16 2108 04/17/16 0525 04/17/16 1434  BP: 116/72 122/66 127/68 116/71  Pulse: 98 90 88 88  Temp: 98.3 F (36.8 C) 98.2  F (36.8 C) 98 F (36.7 C) 98.3 F (36.8 C)  TempSrc: Oral Oral Oral Oral  Resp: Height:      Weight:      SpO2: 100% 99% 100% 100%    Intake/Output Summary (Last 24 hours) at 04/17/16 1709 Last data filed at 04/17/16 0600  Gross per 24 hour  Intake   1080 ml  Output      0 ml  Net   1080 ml   Filed Weights   04/14/16 1359  Weight: 95.709 kg (211 lb)    Examination:  General exam: Appears calm and comfortable  Respiratory system: Clear to auscultation. Respiratory effort normal. Cardiovascular system: S1 & S2 heard, RRR. No JVD, murmurs, rubs, gallops or clicks. No pedal edema. Gastrointestinal system: Abdomen is nondistended, soft and nontender. No organomegaly or masses felt. Normal bowel sounds heard. Central nervous system: Alert and oriented. No focal neurological deficits. Extremities: Symmetric 5 x 5 power. Skin: No rashes, lesions or ulcers Psychiatry: Judgement and insight appear normal. Mood & affect appropriate.     Data Reviewed: I have personally reviewed following labs and imaging studies  CBC:  Recent Labs Lab 04/14/16 1428 04/15/16 0523 04/16/16 0739  WBC 9.9 6.1 4.8  HGB 15.5* 11.8* 10.6*  HCT 42.7 32.2* 29.9*  MCV 72.5* 70.5* 73.3*  PLT 484* 305 268   Basic Metabolic Panel:  Recent Labs Lab 04/15/16 0923 04/15/16 1351 04/15/16 1934 04/16/16 0739 04/17/16 0451  NA 130* 133* 133* 134* 135  K 3.3* 3.2* 4.0 3.7 3.4*  CL 104 109 109 110 109  CO2 18* 19* 18* 18* 21*  GLUCOSE 167* 156* 227* 212* 229*  BUN 7 8 10 7 7   CREATININE 0.46 0.53 0.52 0.49 0.48  CALCIUM 8.2* 8.1* 7.9* 7.9* 8.2*   GFR: Estimated Creatinine Clearance: 108 mL/min (by C-G formula based on Cr of 0.48). Liver Function Tests: No results for input(s): AST, ALT, ALKPHOS, BILITOT, PROT, ALBUMIN in the last 168 hours. No results for input(s): LIPASE, AMYLASE in the last 168 hours. No results for input(s): AMMONIA in the last 168 hours. Coagulation  Profile: No results for input(s): INR, PROTIME in the last 168 hours. Cardiac Enzymes: No results for input(s): CKTOTAL, CKMB, CKMBINDEX, TROPONINI in the last 168 hours. BNP (last 3 results) No results for input(s): PROBNP in the last 8760 hours. HbA1C: No results for input(s): HGBA1C in the last 72 hours. CBG:  Recent Labs Lab 04/16/16 1344 04/16/16 1705 04/16/16 2111 04/17/16 0746 04/17/16 1141  GLUCAP 254* 214* 250* 218* 210*   Lipid Profile: No results for input(s): CHOL, HDL, LDLCALC, TRIG, CHOLHDL, LDLDIRECT in the last 72 hours. Thyroid Function Tests: No results for input(s): TSH, T4TOTAL, FREET4, T3FREE, THYROIDAB in the last 72 hours. Anemia Panel:  Recent Labs  04/17/16 0451  VITAMINB12 349  FOLATE 14.3  FERRITIN 63  TIBC 269  IRON 69  RETICCTPCT 1.8   Sepsis Labs: No results for input(s): PROCALCITON, LATICACIDVEN in the last 168 hours.  Recent Results (from the past 240 hour(s))  MRSA PCR Screening     Status: None   Collection Time: 04/14/16  5:30 PM  Result Value Ref Range Status   MRSA by PCR NEGATIVE NEGATIVE Final    Comment:        The GeneXpert MRSA Assay (FDA approved for NASAL specimens only), is one component of a comprehensive MRSA colonization surveillance program. It is not intended to diagnose MRSA infection nor to guide or monitor treatment for MRSA infections.          Radiology Studies: No results found.      Scheduled Meds: . docusate sodium  100 mg Oral BID  . enoxaparin (LOVENOX) injection  40 mg Subcutaneous Q24H  . gabapentin  100 mg Oral TID  . insulin aspart  0-9 Units Subcutaneous TID WC  . [START ON 04/18/2016] insulin glargine  25 Units Subcutaneous Daily  . protein supplement shake  11 oz Oral BID BM  . sodium bicarbonate  650 mg Oral BID  . sodium chloride flush  3 mL Intravenous Q12H   Continuous Infusions: . sodium chloride 125 mL/hr at 04/16/16 1604     LOS: 3 days    Time spent: 25  minutes.     Alba Coryegalado, Aurilla Coulibaly A, MD [facility] Pager [phone]  If 7PM-7AM, please contact night-coverage [email or web address] Password [email or web address] 04/17/2016, 5:09 PM

## 2016-04-18 LAB — GLUCOSE, CAPILLARY
GLUCOSE-CAPILLARY: 183 mg/dL — AB (ref 65–99)
GLUCOSE-CAPILLARY: 215 mg/dL — AB (ref 65–99)
Glucose-Capillary: 182 mg/dL — ABNORMAL HIGH (ref 65–99)

## 2016-04-18 MED ORDER — FERROUS SULFATE 325 (65 FE) MG PO TABS: 325.0000 mg | ORAL_TABLET | Freq: Every day | ORAL | Status: AC

## 2016-04-18 MED ORDER — "INSULIN SYRINGE 28G X 1/2"" 0.5 ML MISC": 1.0000 "application " | Freq: Every day | Status: AC

## 2016-04-18 MED ORDER — INSULIN GLARGINE 100 UNIT/ML ~~LOC~~ SOLN: 25.0000 [IU] | Freq: Every day | SUBCUTANEOUS | Status: AC

## 2016-04-18 MED ORDER — POTASSIUM CHLORIDE CRYS ER 20 MEQ PO TBCR
20.0000 meq | EXTENDED_RELEASE_TABLET | Freq: Once | ORAL | Status: AC
  Administered 2016-04-18: 20 meq via ORAL
  Filled 2016-04-18: qty 1

## 2016-04-18 MED ORDER — PREMIER PROTEIN SHAKE: 11.0000 [oz_av] | Freq: Two times a day (BID) | ORAL | Status: AC

## 2016-04-18 MED ORDER — BLOOD GLUCOSE MONITOR KIT: PACK | Status: AC

## 2016-04-18 MED ORDER — INSULIN STARTER KIT- SYRINGES (ENGLISH)
1.0000 | Freq: Once | Status: AC
  Administered 2016-04-18: 1
  Filled 2016-04-18: qty 1

## 2016-04-18 NOTE — Progress Notes (Signed)
Inpatient Diabetes Program Recommendations  AACE/ADA: New Consensus Statement on Inpatient Glycemic Control (2015)  Target Ranges:  Prepandial:   less than 140 mg/dL      Peak postprandial:   less than 180 mg/dL (1-2 hours)      Critically ill patients:  140 - 180 mg/dL   Lab Results  Component Value Date   GLUCAP 183* 04/18/2016  Results for Doree FudgeJONES, Brion (MRN [id number]) as of 04/18/2016 11:00  Ref. Range 04/17/2016 07:46 04/17/2016 11:41 04/17/2016 17:09 04/17/2016 20:34 04/18/2016 07:53  Glucose-Capillary Latest Ref Range: 65-99 mg/dL 253218 (H) 664210 (H) 403160 (H) 232 (H) 183 (H)    Review of Glycemic Control Spoke with patient about new diabetes diagnosis.  Explained what an A1C is and goal is 7%.  Discussed basic pathophysiology of DM Type 2, basic home care, importance of checking CBGs and maintaining good CBG control to prevent long-term and short-term complications. Reviewed glucose and A1C goals and explained that patient will need to continue to  Reviewed signs and symptoms of hyperglycemia and hypoglycemia along with treatment for both. Discussed impact of nutrition, exercise, stress, sickness, and medications on diabetes control. Reviewed Living Well with diabetes booklet and encouraged patient to read through entire book.  Asked patient to check his glucose 4 times per day (before meals and at bedtime) and to keep a log book of glucose readings and insulin taken. Explained how the doctor he follows up with can use the log book to continue to make insulin adjustments if needed. Reviewed how to draw up and administer insulin with vial and syringe. Patient was able to successfully administer insulin with vial and syringe. Informed patient that RN will be asking him to self-administer insulin to ensure proper technique and ability to administer self insulin shots.   Patient verbalized understanding of information discussed and he states that he has no further questions at this time related to  diabetes.   RNs to provide ongoing basic DM education at bedside with this patient and engage patient to actively check blood glucose and administer insulin injections.  To be discharged on Lantus 25 units QD.  Pt states she is willing to do 4 shots/day for optimal glycemic control. To contact MD to consider adding Novolog or Humalog 4 units tidwc.  Expect good compliance as pt very motivated to make lifestyle changes to control blood sugars at home.  Thank you. Ailene Ardshonda Shonnie Poudrier, RD, LDN, CDE Inpatient Diabetes Coordinator [phone]           Inpatient Diabetes Program Recommendations:

## 2016-04-18 NOTE — Progress Notes (Signed)
Went over all d/c instructions with patient.  Patient verbalized understanding.  Left hospital via w/c with all belongings and hard scripts.

## 2016-04-18 NOTE — Clinical Social Work Note (Addendum)
Clinical Social Work Assessment  Patient Details  Name: Amy Klein MRN: [id number] Date of Birth: [birth year]  Date of referral:  04/18/16               Reason for consult:  Emotional/Coping/Adjustment to Illness                Permission sought to share information with:    Permission granted to share information::     Name::        Agency::     Relationship::     Contact Information:     Housing/Transportation Living arrangements for the past 2 months:  Single Family Home Source of Information:  Patient Patient Interpreter Needed:  None Criminal Activity/Legal Involvement Pertinent to Current Situation/Hospitalization:  No - Comment as needed Significant Relationships:  Spouse Lives with:  Spouse Do you feel safe going back to the place where you live?  Yes Need for family participation in patient care:  No (Coment)  Care giving concerns:  Patient reports she feels overwhelm with her health. Patient reports," I researched about diabetes and learned that stress can make it worse. I am stressed". Patient reports her job, relationship with spouse and health are causing her to feel stressed.    Social Worker assessment / plan:  LCSWA met with patient at bedside, patient accepting and agreeable to assessment. Patient reports she has been overwhelmed and stressed. Patient reports she sees a Social worker in the community at [employer]. She reports,"it is helping me a lot. I have an appointment set up next week, since I missed this appointment". Patient reports she has been trying to get her husband to go to marriage counseling with her, but has not succeeded. Patient reports she is extremely overwhelmed at work and reached out to their employee services  regarding stress support. LCSWA used motivational interviewing and commended patient for her tenacity to take the initiative, in trying to control her stress. LCSWA provided patient with requested resources on stress management.    Employment status:  [employer] information:  Managed Care PT Recommendations:  Not assessed at this time Information / Referral to community resources:     Patient/Family's Response to care:  Patient has responded well to care and is agreeable to treatment. Patient is hopeful that her husband will be supportive.  Patient/Family's Understanding of and Emotional Response to Diagnosis, Current Treatment, and Prognosis:  "I was feeling more overwhelmed yesterday but I feel better to day".Patient has a understanding of her health and prognosis. She understands stress factors in her life are contributing to her health. Patient is currently seeing a therapist in the community.   Emotional Assessment Appearance:  Appears stated age Attitude/Demeanor/Rapport:   (cooperative) Affect (typically observed):  Hopeful, Accepting, Calm, Pleasant Orientation:  Oriented to Self, Oriented to Place, Oriented to  Time, Oriented to Situation Alcohol / Substance use:  Not Applicable Psych involvement (Current and /or in the community):  No (Comment)  Discharge Needs  Concerns to be addressed:  No discharge needs identified Readmission within the last 30 days:  No Current discharge risk:  None Barriers to Discharge:  No Barriers Identified   Lia Hopping, LCSW 04/18/2016, 2:21 PM

## 2016-04-18 NOTE — Discharge Summary (Signed)
Physician Discharge Summary  Amy Klein MEB:[id number] DOB: 05-Aug-1976 DOA: 04/14/2016  PCP: Amy Amel, MD  Admit date: 04/14/2016 Discharge date: 04/18/2016  Time spent: 35 minutes  Recommendations for Outpatient Follow-up:  Needs B-met  To follow K and bicarb level.  Needs further titration of medications for Dm.  Needs further evaluation for anemia.  Resume BP medication as needed. Consider low dose lisinopril.   Discharge Diagnoses:    DKA (diabetic ketoacidoses) ([place])   HTN (hypertension), benign   Hyponatremia   Discharge Condition: Stable  Diet recommendation: Carb Modified.   Filed Weights   04/14/16 1359  Weight: 95.709 kg (211 lb)    History of present illness:  Amy Klein is a 40 y.o. female with medical history significant of HTN, pre diabetic who was refer to ED by PCP for evaluation of hyperglycemia. Patient went to see her PCP today because she has not been feeling well for last 2 weeks. She report fatigue, dyspnea on exertion, polyuria, polydipsia. She was weight today at her PCP office and she has lost 20 pounds since may.  She denies abdominal pain,nausea,vomiting, chest pain.   ED Course: Presents hyperglycemia, DKA, PH 7.2, CBG 429, bicarb 12, sodium 126. HR 144.   Hospital Course:  1-DKA, DM; Ph 7.2 , gap 21, CBG 429 Prior history pre -diabetes.  Received IV fluids, insulin Gtt.  gap close, but bicarb at 18. She was transition to lantus.  Hb A1c at 14 at physician office.  Discharge on lantus to 25 units.  Continue with SSI.  Diet education provided.  2-HTN; hold HCTZ due to dehydration.  PRN hydralazine.  SBP stable , follow up with PCP for further management.   3-Tachycardia; In setting of dehydration. IV fluids. Resolved   4-Hyponatremia; multifactorial pseudohyponatremia and secondary to hypovolemia.  Improving.   5-Anxiety; she has not been taking xanax. Resume gabapentin  Depression. Denies suicidal thoughts, feels  overwhelm with diagnosis and work problems. SW for support. She is feeling better today, better spirit.  6-hypokalemia; replaced    Procedures:  none  Consultations:  none  Discharge Exam: Filed Vitals:   04/17/16 2031 04/18/16 0524  BP: 119/81 127/75  Pulse: 81 76  Temp: 98.5 F (36.9 C) 98 F (36.7 C)  Resp: 20 16    General: NAD Cardiovascular: S 1, S 2 RRR Respiratory: CTA  Discharge Instructions   Discharge Instructions    Ambulatory referral to Nutrition and Diabetic Education    Complete by:  As directed      Diet Carb Modified    Complete by:  As directed      Increase activity slowly    Complete by:  As directed           Current Discharge Medication List    START taking these medications   Details  blood glucose meter kit and supplies KIT Dispense based on patient and insurance preference. Use up to four times daily as directed. (FOR ICD-9 250.00, 250.01). Qty: 1 each, Refills: 0    ferrous sulfate 325 (65 FE) MG tablet Take 1 tablet (325 mg total) by mouth daily with breakfast. Qty: 30 tablet, Refills: 0    insulin glargine (LANTUS) 100 UNIT/ML injection Inject 0.25 mLs (25 Units total) into the skin daily. Qty: 10 mL, Refills: 11    INSULIN SYRINGE .5CC/28G 28G X 1/2" 0.5 ML MISC 1 application by Does not apply route daily. Qty: 30 each, Refills: 0    protein supplement shake (PREMIER PROTEIN) LIQD  Take 325 mLs (11 oz total) by mouth 2 (two) times daily between meals. Qty: 10 Can, Refills: 0      CONTINUE these medications which have NOT CHANGED   Details  acetaminophen (TYLENOL) 500 MG tablet Take 500-1,000 mg by mouth every 6 (six) hours as needed for moderate pain or headache.    ALPRAZolam (XANAX) 0.25 MG tablet Take 0.25 mg by mouth daily as needed for anxiety.    Cholecalciferol (VITAMIN D PO) Take 1 capsule by mouth daily.    gabapentin (NEURONTIN) 100 MG capsule Take 100 mg by mouth 3 (three) times daily.    GARLIC PO Take 1  capsule by mouth daily.    Lactase (LACTAID PO) Take 1 tablet by mouth daily as needed (lactos allergy).    Multiple Vitamin (MULTIVITAMIN WITH MINERALS) TABS tablet Take 1 tablet by mouth daily.    Simethicone (GAS-X PO) Take 1 tablet by mouth daily as needed (gas).      STOP taking these medications     cyclobenzaprine (FLEXERIL) 10 MG tablet      hydrochlorothiazide (HYDRODIURIL) 25 MG tablet      ibuprofen (ADVIL,MOTRIN) 200 MG tablet        Allergies  Allergen Reactions  . Lactose Intolerance (Gi)     Gas, stomach discomfort  . Sulfa Antibiotics Hives   Follow-up Information    Follow up with Amy Amel, MD.   Specialty:  Family Medicine   Why:  keep appointment.    Contact information:   [address] [place] [id number] [phone]        The results of significant diagnostics from this hospitalization (including imaging, microbiology, ancillary and laboratory) are listed below for reference.    Significant Diagnostic Studies: Dg Chest 2 View  04/14/2016  CLINICAL DATA:  Shortness of breath, woozy feeling at times, chest discomfort today, hypertension EXAM: CHEST  2 VIEW COMPARISON:  None FINDINGS: Normal heart size, mediastinal contours, and pulmonary vascularity. Lungs clear. No pleural effusion or pneumothorax. Bones unremarkable. IMPRESSION: Normal exam. Electronically Signed   By: Lavonia Dana M.D.   On: 04/14/2016 14:40    Microbiology: Recent Results (from the past 240 hour(s))  MRSA PCR Screening     Status: None   Collection Time: 04/14/16  5:30 PM  Result Value Ref Range Status   MRSA by PCR NEGATIVE NEGATIVE Final    Comment:        The GeneXpert MRSA Assay (FDA approved for NASAL specimens only), is one component of a comprehensive MRSA colonization surveillance program. It is not intended to diagnose MRSA infection nor to guide or monitor treatment for MRSA infections.      Labs: Basic Metabolic  Panel:  Recent Labs Lab 04/15/16 0923 04/15/16 1351 04/15/16 1934 04/16/16 0739 04/17/16 0451  NA 130* 133* 133* 134* 135  K 3.3* 3.2* 4.0 3.7 3.4*  CL 104 109 109 110 109  CO2 18* 19* 18* 18* 21*  GLUCOSE 167* 156* 227* 212* 229*  BUN '7 8 10 7 7  ' CREATININE 0.46 0.53 0.52 0.49 0.48  CALCIUM 8.2* 8.1* 7.9* 7.9* 8.2*   Liver Function Tests: No results for input(s): AST, ALT, ALKPHOS, BILITOT, PROT, ALBUMIN in the last 168 hours. No results for input(s): LIPASE, AMYLASE in the last 168 hours. No results for input(s): AMMONIA in the last 168 hours. CBC:  Recent Labs Lab 04/14/16 1428 04/15/16 0523 04/16/16 0739  WBC 9.9 6.1 4.8  HGB 15.5* 11.8* 10.6*  HCT 42.7 32.2* 29.9*  MCV 72.5* 70.5* 73.3*  PLT 484* 305 268   Cardiac Enzymes: No results for input(s): CKTOTAL, CKMB, CKMBINDEX, TROPONINI in the last 168 hours. BNP: BNP (last 3 results) No results for input(s): BNP in the last 8760 hours.  ProBNP (last 3 results) No results for input(s): PROBNP in the last 8760 hours.  CBG:  Recent Labs Lab 04/17/16 0746 04/17/16 1141 04/17/16 1709 04/17/16 2034 04/18/16 0753  GLUCAP 218* 210* 160* 232* 183*       Signed:  Niel Hummer A MD.  [facility] 04/18/2016, 9:52 AM

## 2016-04-26 ENCOUNTER — Encounter: Payer: BLUE CROSS/BLUE SHIELD | Attending: Family Medicine | Admitting: *Deleted

## 2016-04-26 ENCOUNTER — Encounter: Payer: Self-pay | Admitting: *Deleted

## 2016-04-26 VITALS — Ht 66.5 in | Wt 227.3 lb

## 2016-04-26 DIAGNOSIS — E1165 Type 2 diabetes mellitus with hyperglycemia: Secondary | ICD-10-CM

## 2016-04-26 DIAGNOSIS — Z713 Dietary counseling and surveillance: Secondary | ICD-10-CM | POA: Diagnosis not present

## 2016-04-26 DIAGNOSIS — E118 Type 2 diabetes mellitus with unspecified complications: Secondary | ICD-10-CM

## 2016-04-26 DIAGNOSIS — E1310 Other specified diabetes mellitus with ketoacidosis without coma: Secondary | ICD-10-CM | POA: Insufficient documentation

## 2016-04-26 NOTE — Patient Instructions (Signed)
Plan:  Aim for 4 Carb Choices per meal (60 grams) +/- 1 either way  Aim for 0-2 Carbs per snack if hungry  Include protein in moderation with your meals and snacks Consider reading food labels for Total Carbohydrate of foods Continue checking BG at alternate times per day  Continue taking medication Lantus as directed by MD

## 2016-04-26 NOTE — Progress Notes (Signed)
Diabetes Self-Management Education  Visit Type: First/Initial  Appt. Start Time: 1000 Appt. End Time: 1130  04/26/2016  Amy Klein, identified by name and date of birth, is a 40 y.o. female with a diagnosis of Diabetes: Type 2.   ASSESSMENT  Height 5' 6.5" (1.689 m), weight 227 lb 4.8 oz (103.103 kg), last menstrual period 03/22/2016. Body mass index is 36.14 kg/(m^2).      Diabetes Self-Management Education - 04/26/16 1020    Visit Information   Visit Type First/Initial   Initial Visit   Diabetes Type Type 2   Are you currently following a meal plan? Yes   What type of meal plan do you follow? low carb   Are you taking your medications as prescribed? Yes   Date Diagnosed 04/14/2016   Health Coping   How would you rate your overall health? Good   Psychosocial Assessment   Patient Belief/Attitude about Diabetes Motivated to manage diabetes   Self-care barriers None   Other persons present Patient   Patient Concerns Nutrition/Meal planning;Glycemic Control   Special Needs None   Preferred Learning Style Visual   Learning Readiness Change in progress   What is the last grade level you completed in school? 12   Pre-Education Assessment   Patient understands the diabetes disease and treatment process. Needs Instruction   Patient understands incorporating nutritional management into lifestyle. Needs Instruction   Patient undertands incorporating physical activity into lifestyle. Needs Instruction   Patient understands using medications safely. Needs Instruction   Patient understands monitoring blood glucose, interpreting and using results Needs Instruction   Patient understands prevention, detection, and treatment of acute complications. Needs Instruction   Patient understands prevention, detection, and treatment of chronic complications. Needs Instruction   Patient understands how to develop strategies to address psychosocial issues. Needs Instruction   Patient  understands how to develop strategies to promote health/change behavior. Needs Instruction   Complications   Last HgB A1C per patient/outside source 14 %   How often do you check your blood sugar? 3-4 times/day   Number of hypoglycemic episodes per month 0   Have you had a dilated eye exam in the past 12 months? Yes   Have you had a dental exam in the past 12 months? No   Are you checking your feet? No   Dietary Intake   Breakfast skips often if sleeps later   Lunch trying to eat lower carb foods, sandwich occasionally with chips   Snack (afternoon) almonds, crackers with cheese, fresh fruit   Dinner meat, starch, vegetables, occasionally dessert like ice cream or cake   Snack (evening) fresh fruit   Beverage(s) water, diet soda, crystal light   Exercise   Exercise Type Light (walking / raking leaves)  has treadmill at home   How many days per week to you exercise? 2.5   How many minutes per day do you exercise? 20   Total minutes per week of exercise 50   Patient Education   Previous Diabetes Education No   Disease state  Definition of diabetes, type 1 and 2, and the diagnosis of diabetes;Factors that contribute to the development of diabetes   Nutrition management  Role of diet in the treatment of diabetes and the relationship between the three main macronutrients and blood glucose level;Food label reading, portion sizes and measuring food.;Carbohydrate counting   Physical activity and exercise  Role of exercise on diabetes management, blood pressure control and cardiac health.   Medications Reviewed patients medication  for diabetes, action, purpose, timing of dose and side effects.   Monitoring Identified appropriate SMBG and/or A1C goals.;Purpose and frequency of SMBG.   Acute complications Taught treatment of hypoglycemia - the 15 rule.   Chronic complications Relationship between chronic complications and blood glucose control   Psychosocial adjustment Role of stress on diabetes    Individualized Goals (developed by patient)   Nutrition Follow meal plan discussed   Physical Activity Exercise 1-2 times per week   Medications take my medication as prescribed   Monitoring  test blood glucose pre and post meals as discussed   Reducing Risk treat hypoglycemia with 15 grams of carbs if blood glucose less than /dL   Post-Education Assessment   Patient understands the diabetes disease and treatment process. Demonstrates understanding / competency   Patient understands incorporating nutritional management into lifestyle. Demonstrates understanding / competency   Patient undertands incorporating physical activity into lifestyle. Needs Review   Patient understands using medications safely. Demonstrates understanding / competency   Patient understands monitoring blood glucose, interpreting and using results Demonstrates understanding / competency   Patient understands prevention, detection, and treatment of acute complications. Demonstrates understanding / competency   Patient understands prevention, detection, and treatment of chronic complications. Demonstrates understanding / competency   Patient understands how to develop strategies to address psychosocial issues. Needs Review   Patient understands how to develop strategies to promote health/change behavior. Needs Review   Outcomes   Expected Outcomes Demonstrated interest in learning. Expect positive outcomes   Future DMSE PRN   Program Status Not Completed      Individualized Plan for Diabetes Self-Management Training:   Learning Objective:  Patient will have a greater understanding of diabetes self-management. Patient education plan is to attend individual and/or group sessions per assessed needs and concerns.   Plan:   Patient Instructions  Plan:  Aim for 4 Carb Choices per meal (60 grams) +/- 1 either way  Aim for 0-2 Carbs per snack if hungry  Include protein in moderation with your meals and  snacks Consider reading food labels for Total Carbohydrate of foods Continue checking BG at alternate times per day  Continue taking medication Lantus as directed by MD      Expected Outcomes:  Demonstrated interest in learning. Expect positive outcomes  Education material provided: Living Well with Diabetes, A1C conversion sheet, Meal plan card, Snack sheet and Carbohydrate counting sheet  If problems or questions, patient to contact team via:  Phone and Email  Future DSME appointment: PRN

## 2016-06-26 ENCOUNTER — Other Ambulatory Visit: Payer: Self-pay | Admitting: Obstetrics and Gynecology

## 2016-06-26 DIAGNOSIS — R928 Other abnormal and inconclusive findings on diagnostic imaging of breast: Secondary | ICD-10-CM

## 2016-06-29 ENCOUNTER — Other Ambulatory Visit: Payer: Self-pay | Admitting: Obstetrics and Gynecology

## 2016-06-29 ENCOUNTER — Ambulatory Visit
Admission: RE | Admit: 2016-06-29 | Discharge: 2016-06-29 | Disposition: A | Discharge status: Home / Self Care | Payer: BLUE CROSS/BLUE SHIELD | Source: Ambulatory Visit | Attending: Obstetrics and Gynecology | Admitting: Obstetrics and Gynecology

## 2016-06-29 DIAGNOSIS — R928 Other abnormal and inconclusive findings on diagnostic imaging of breast: Secondary | ICD-10-CM

## 2017-07-17 ENCOUNTER — Other Ambulatory Visit: Payer: Self-pay | Admitting: Obstetrics and Gynecology

## 2017-07-17 DIAGNOSIS — L049 Acute lymphadenitis, unspecified: Secondary | ICD-10-CM

## 2017-07-20 ENCOUNTER — Other Ambulatory Visit: Payer: BLUE CROSS/BLUE SHIELD

## 2017-07-27 ENCOUNTER — Other Ambulatory Visit: Payer: BLUE CROSS/BLUE SHIELD

## 2019-09-02 ENCOUNTER — Other Ambulatory Visit: Payer: Self-pay | Admitting: Obstetrics and Gynecology

## 2019-09-02 ENCOUNTER — Other Ambulatory Visit: Payer: Self-pay | Admitting: *Deleted

## 2019-09-02 DIAGNOSIS — R590 Localized enlarged lymph nodes: Secondary | ICD-10-CM

## 2019-09-10 ENCOUNTER — Ambulatory Visit: Payer: BLUE CROSS/BLUE SHIELD

## 2019-09-10 ENCOUNTER — Ambulatory Visit
Admission: RE | Admit: 2019-09-10 | Discharge: 2019-09-10 | Disposition: A | Discharge status: Home / Self Care | Payer: BC Managed Care – PPO | Source: Ambulatory Visit | Attending: Obstetrics and Gynecology | Admitting: Obstetrics and Gynecology

## 2019-09-10 ENCOUNTER — Other Ambulatory Visit: Payer: Self-pay

## 2019-09-10 DIAGNOSIS — R590 Localized enlarged lymph nodes: Secondary | ICD-10-CM

## 2022-02-01 ENCOUNTER — Other Ambulatory Visit: Payer: Self-pay | Admitting: Obstetrics and Gynecology

## 2023-09-19 ENCOUNTER — Other Ambulatory Visit: Payer: Self-pay

## 2023-09-19 ENCOUNTER — Encounter (HOSPITAL_COMMUNITY): Payer: Self-pay

## 2023-09-19 ENCOUNTER — Emergency Department (HOSPITAL_COMMUNITY)
Admission: EM | Admit: 2023-09-19 | Discharge: 2023-09-19 | Disposition: A | Discharge status: Home / Self Care | Payer: No Typology Code available for payment source | Attending: Emergency Medicine | Admitting: Emergency Medicine

## 2023-09-19 ENCOUNTER — Emergency Department (HOSPITAL_COMMUNITY): Payer: No Typology Code available for payment source

## 2023-09-19 DIAGNOSIS — Z79899 Other long term (current) drug therapy: Secondary | ICD-10-CM | POA: Diagnosis not present

## 2023-09-19 DIAGNOSIS — R072 Precordial pain: Secondary | ICD-10-CM | POA: Diagnosis present

## 2023-09-19 DIAGNOSIS — R0789 Other chest pain: Secondary | ICD-10-CM | POA: Insufficient documentation

## 2023-09-19 DIAGNOSIS — I10 Essential (primary) hypertension: Secondary | ICD-10-CM | POA: Diagnosis not present

## 2023-09-19 DIAGNOSIS — E119 Type 2 diabetes mellitus without complications: Secondary | ICD-10-CM | POA: Diagnosis not present

## 2023-09-19 DIAGNOSIS — Z794 Long term (current) use of insulin: Secondary | ICD-10-CM | POA: Diagnosis not present

## 2023-09-19 LAB — CBC
HCT: 39.2 % (ref 36.0–46.0)
Hemoglobin: 13.2 g/dL (ref 12.0–15.0)
MCH: 26.4 pg (ref 26.0–34.0)
MCHC: 33.7 g/dL (ref 30.0–36.0)
MCV: 78.4 fL — ABNORMAL LOW (ref 80.0–100.0)
Platelets: 399 10*3/uL (ref 150–400)
RBC: 5 MIL/uL (ref 3.87–5.11)
RDW: 14.6 % (ref 11.5–15.5)
WBC: 5.7 10*3/uL (ref 4.0–10.5)
nRBC: 0 % (ref 0.0–0.2)

## 2023-09-19 LAB — BASIC METABOLIC PANEL
Anion gap: 11 (ref 5–15)
BUN: 12 mg/dL (ref 6–20)
CO2: 22 mmol/L (ref 22–32)
Calcium: 9.3 mg/dL (ref 8.9–10.3)
Chloride: 103 mmol/L (ref 98–111)
Creatinine, Ser: 0.66 mg/dL (ref 0.44–1.00)
GFR, Estimated: >60 mL/min (ref >60)
Glucose, Bld: 163 mg/dL — ABNORMAL HIGH (ref 70–99)
Potassium: 4 mmol/L (ref 3.5–5.1)
Sodium: 136 mmol/L (ref 135–145)

## 2023-09-19 LAB — HCG, SERUM, QUALITATIVE: Preg, Serum: NEGATIVE

## 2023-09-19 LAB — TROPONIN I (HIGH SENSITIVITY)
Troponin I (High Sensitivity): <2 ng/L (ref <18)
Troponin I (High Sensitivity): <2 ng/L (ref <18)

## 2023-09-19 MED ORDER — CLONIDINE HCL 0.1 MG PO TABS
0.1000 mg | ORAL_TABLET | Freq: Once | ORAL | Status: AC
  Administered 2023-09-19: 0.1 mg via ORAL
  Filled 2023-09-19: qty 1

## 2023-09-19 MED ORDER — CLONIDINE HCL 0.1 MG PO TABS: 0.1000 mg | ORAL_TABLET | Freq: Two times a day (BID) | ORAL | 0 refills | Status: AC | PRN

## 2023-09-19 NOTE — Discharge Instructions (Addendum)
I prescribed you a rescue medication for your elevated blood pressure.  If your systolic blood pressure or the top number is over 180 mmhg, or the diastolic pressure bottom numbers over 110 mmhg, you can take a clonidine tablet.  Before taking this medicine, you should try to relax is much as possible in a quiet room for at least 5 minutes, and recheck your blood pressure on the other arm, to make sure that it is truly elevated.  Please follow-up with your doctor at end of the week.  Try to keep a daily diary of your blood pressure by checking once or twice a day.  This is helpful for your doctor to review your average blood pressures.  I also referred you to see a cardiologist or heart specialist.  This is because you do have risk factors for heart disease.  If you do not hear from their scheduling office within 3 business days please call the number above for [facility] to request a follow-up appointment.

## 2023-09-19 NOTE — ED Provider Notes (Signed)
[place] EMERGENCY DEPARTMENT AT [facility] Provider Note   CSN: [id number] Arrival date & time: 09/19/23  1220     History  Chief Complaint  Patient presents with   Hypertension   Chest Pain    Amy Klein is a 47 y.o. female with a history of high blood pressure, anxiety, diabetes, presenting from home with concern for chest pressure, headache.  Patient reports that her average systolic pressure usually in the 130s.  She is on metoprolol 25 mg recently increased by her PCP to 50 mg, as well as olmesartan 40 mg daily.  She says that when she checked her blood pressure today, after beginning to have chest pressure while at rest, her pressure was 180/100 mmhg. she says that is extremely high.  She has a mild frontal headache.  She denies smoking history or any recent high salt intake.  She has another appointment with her PCP scheduled at the end of the week but is concerned about these other symptoms.  She denies any known personal or significant family history of MI or coronary disease or heart attack at a young age.  HPI     Home Medications Prior to Admission medications   Medication Sig Start Date End Date Taking? Authorizing Provider  cloNIDine (CATAPRES) 0.1 MG tablet Take 1 tablet (0.1 mg total) by mouth 2 (two) times daily as needed for up to 30 doses. For systolic pressure greater than 180 mmhg (top number) or diastolic pressure greater than 110 mmhg (bottom number) 09/19/23  Yes Toyoko Silos, Kermit Balo, MD  acetaminophen (TYLENOL) 500 MG tablet Take 500-1,000 mg by mouth every 6 (six) hours as needed for moderate pain or headache.    [provider]  ALPRAZolam Prudy Feeler) 0.25 MG tablet Take 0.25 mg by mouth daily as needed for anxiety.    [provider]  blood glucose meter kit and supplies KIT Dispense based on patient and insurance preference. Use up to four times daily as directed. (FOR ICD-9 250.00, 250.01). 04/18/16   Regalado, Belkys A, MD   Cholecalciferol (VITAMIN D PO) Take 1 capsule by mouth daily.    [provider]  ferrous sulfate 325 (65 FE) MG tablet Take 1 tablet (325 mg total) by mouth daily with breakfast. 04/18/16   Regalado, Belkys A, MD  gabapentin (NEURONTIN) 100 MG capsule Take 100 mg by mouth 3 (three) times daily.    [provider]  GARLIC PO Take 1 capsule by mouth daily.    [provider]  insulin glargine (LANTUS) 100 UNIT/ML injection Inject 0.25 mLs (25 Units total) into the skin daily. 04/18/16   Regalado, Belkys A, MD  INSULIN SYRINGE .5CC/28G 28G X 1/2" 0.5 ML MISC 1 application by Does not apply route daily. 04/18/16   Regalado, Belkys A, MD  Lactase (LACTAID PO) Take 1 tablet by mouth daily as needed (lactos allergy).    [provider]  Multiple Vitamin (MULTIVITAMIN WITH MINERALS) TABS tablet Take 1 tablet by mouth daily.    [provider]  protein supplement shake (PREMIER PROTEIN) LIQD Take 325 mLs (11 oz total) by mouth 2 (two) times daily between meals. 04/18/16   Regalado, Belkys A, MD  Simethicone (GAS-X PO) Take 1 tablet by mouth daily as needed (gas).    [provider]      Allergies    Lactose intolerance (gi) and Sulfa antibiotics    Review of Systems   Review of Systems  Physical Exam Updated Vital Signs  BP (!) 156/96   Pulse 86   Temp 98.8 F (37.1 C) (Oral)   Resp 17   Ht 5' 6.5" (1.689 m)   Wt 98.9 kg   SpO2 97%   BMI 34.66 kg/m  Physical Exam Constitutional:      General: She is not in acute distress. HENT:     Head: Normocephalic and atraumatic.  Eyes:     Conjunctiva/sclera: Conjunctivae normal.     Pupils: Pupils are equal, round, and reactive to light.  Cardiovascular:     Rate and Rhythm: Normal rate and regular rhythm.  Pulmonary:     Effort: Pulmonary effort is normal. No respiratory distress.  Abdominal:     General: There is no distension.     Tenderness: There is no abdominal tenderness.  Skin:     General: Skin is warm and dry.  Neurological:     General: No focal deficit present.     Mental Status: She is alert. Mental status is at baseline.  Psychiatric:        Mood and Affect: Mood normal.        Behavior: Behavior normal.     ED Results / Procedures / Treatments   Labs (all labs ordered are listed, but only abnormal results are displayed) Labs Reviewed  BASIC METABOLIC PANEL - Abnormal; Notable for the following components:      Result Value   Glucose, Bld 163 (*)    All other components within normal limits  CBC - Abnormal; Notable for the following components:   MCV 78.4 (*)    All other components within normal limits  HCG, SERUM, QUALITATIVE  TROPONIN I (HIGH SENSITIVITY)  TROPONIN I (HIGH SENSITIVITY)    EKG EKG Interpretation Date/Time:  Wednesday September 19 2023 12:40:15 EST Ventricular Rate:  87 PR Interval:  140 QRS Duration:  85 QT Interval:  363 QTC Calculation: 437 R Axis:   4  Text Interpretation: Sinus rhythm Confirmed by Alvester Chou [phone]) on 09/19/2023 3:03:53 PM  Radiology DG Chest 2 View  Result Date: 09/19/2023 CLINICAL DATA:  Chest pain EXAM: CHEST - 2 VIEW COMPARISON:  04/14/2016 FINDINGS: The heart size and mediastinal contours are within normal limits. Both lungs are clear. The visualized skeletal structures are unremarkable. IMPRESSION: No active cardiopulmonary disease. Electronically Signed   By: Duanne Guess D.O.   On: 09/19/2023 14:01    Procedures Procedures    Medications Ordered in ED Medications  cloNIDine (CATAPRES) tablet 0.1 mg (0.1 mg Oral Given 09/19/23 1530)    ED Course/ Medical Decision Making/ A&P                                 Medical Decision Making Amount and/or Complexity of Data Reviewed Labs: ordered. Radiology: ordered.  Risk Prescription drug management.   This patient presents to the ED with concern for chest pressure, headache, high blood pressure. This involves an extensive  number of treatment options, and is a complaint that carries with it a high risk of complications and morbidity.  The differential diagnosis includes hypertensive urgency versus pneumonia vs PTX vs viral URI vs gastritis vs other  Headache appears to be mild and tension type.  No red flag features for acute meningitis, subarachnoid hemorrhage.  No indication for LP or emergent neuroimaging at this time.  No confusion or encephalopathy.  Co-morbidities that complicate the patient evaluation: Cardiovascular risk factors include high blood pressure  and diabetes  I ordered and personally interpreted labs.  The pertinent results include: Troponins are negative.  No acute anemia.  No leukocytosis.  Labs are unremarkable.  I ordered imaging studies including x-ray of the chest I independently visualized and interpreted imaging which showed no emergent findings I agree with the radiologist interpretation  The patient was maintained on a cardiac monitor.  I personally viewed and interpreted the cardiac monitored which showed an underlying rhythm of: Sinus rhythm  Per my interpretation the patient's ECG shows sinus rhythm with no acute ischemic findings  I ordered medication including clonidine 0.1 mg for hypertension  I have reviewed the patients home medicines and have made adjustments as needed  Test Considered: Low suspicion for acute PE, no acute risk factors for PE.  No indication for CT angiogram  After the interventions noted above, I reevaluated the patient and found that they have: improved   Dispostion:  After consideration of the diagnostic results and the patients response to treatment, I feel that the patent would benefit from follow-up with her PCP regarding her elevated blood pressure.  I do think it is reasonable to refer her to cardiology given her cardiovascular risk factors and reported chest pressure, but with HEART score < 3, she is reasonable to follow-up as an outpatient and  not requiring hospitalization at this time.  The patient is in agreement with this plan.  Because she is able to reliably monitor her blood pressure at home we discussed clonidine prescription as a rescue medication for persistent elevated high blood pressure.  Her PCP may wish to adjust, discontinue, or alter her blood pressure medications when she is seen later this week.        Final Clinical Impression(s) / ED Diagnoses Final diagnoses:  Chest pressure  Hypertension, unspecified type    Rx / DC Orders ED Discharge Orders          Ordered    cloNIDine (CATAPRES) 0.1 MG tablet  2 times daily PRN        09/19/23 1538    Ambulatory referral to Cardiology       Comments: Elevated blood pressure with chest pressure   09/19/23 1538              Canesha Tesfaye, Kermit Balo, MD 09/19/23 [phone]

## 2023-09-19 NOTE — ED Triage Notes (Signed)
High blood pressure for the last few weeks, BP meds increased yesterday at PCP. Pt on metoprolol. C/o midsternal chest pain that does not radiate.

## 2023-11-28 ENCOUNTER — Ambulatory Visit: Payer: No Typology Code available for payment source | Admitting: Cardiology

## 2024-02-05 ENCOUNTER — Ambulatory Visit: Payer: No Typology Code available for payment source | Admitting: Cardiology
